# Patient Record
Sex: Female | Born: 1965 | Race: White | Hispanic: No | Marital: Married | Smoking: Former smoker
Health system: Southern US, Community
[De-identification: ages and names within clinical notes are randomized; demographics above are authoritative.]

## PROBLEM LIST (undated history)

## (undated) DIAGNOSIS — M722 Plantar fascial fibromatosis: Secondary | ICD-10-CM

## (undated) DIAGNOSIS — N951 Menopausal and female climacteric states: Secondary | ICD-10-CM

## (undated) DIAGNOSIS — J189 Pneumonia, unspecified organism: Secondary | ICD-10-CM

## (undated) DIAGNOSIS — M8580 Other specified disorders of bone density and structure, unspecified site: Secondary | ICD-10-CM

## (undated) DIAGNOSIS — R519 Headache, unspecified: Secondary | ICD-10-CM

## (undated) DIAGNOSIS — E785 Hyperlipidemia, unspecified: Secondary | ICD-10-CM

## (undated) DIAGNOSIS — R55 Syncope and collapse: Secondary | ICD-10-CM

## (undated) DIAGNOSIS — R42 Dizziness and giddiness: Secondary | ICD-10-CM

## (undated) DIAGNOSIS — M810 Age-related osteoporosis without current pathological fracture: Secondary | ICD-10-CM

## (undated) DIAGNOSIS — R51 Headache: Secondary | ICD-10-CM

## (undated) HISTORY — DX: Headache: R51

## (undated) HISTORY — DX: Hyperlipidemia, unspecified: E78.5

## (undated) HISTORY — DX: Pneumonia, unspecified organism: J18.9

## (undated) HISTORY — DX: Age-related osteoporosis without current pathological fracture: M81.0

## (undated) HISTORY — DX: Dizziness and giddiness: R42

## (undated) HISTORY — DX: Syncope and collapse: R55

## (undated) HISTORY — DX: Headache, unspecified: R51.9

## (undated) HISTORY — DX: Other specified disorders of bone density and structure, unspecified site: M85.80

## (undated) HISTORY — DX: Menopausal and female climacteric states: N95.1

## (undated) HISTORY — DX: Plantar fascial fibromatosis: M72.2

---

## 1986-11-11 HISTORY — PX: BREAST REDUCTION SURGERY: SHX8

## 1986-11-11 HISTORY — PX: REDUCTION MAMMAPLASTY: SUR839

## 2000-11-11 HISTORY — PX: BUNIONECTOMY: SHX129

## 2005-09-16 ENCOUNTER — Ambulatory Visit: Payer: Self-pay | Admitting: Family Medicine

## 2006-10-18 ENCOUNTER — Encounter: Admission: RE | Admit: 2006-10-18 | Discharge: 2006-10-18 | Payer: Self-pay | Admitting: Obstetrics and Gynecology

## 2007-08-17 DIAGNOSIS — K219 Gastro-esophageal reflux disease without esophagitis: Secondary | ICD-10-CM | POA: Insufficient documentation

## 2007-11-23 ENCOUNTER — Other Ambulatory Visit: Admission: RE | Admit: 2007-11-23 | Discharge: 2007-11-23 | Payer: Self-pay | Admitting: Obstetrics & Gynecology

## 2007-11-27 ENCOUNTER — Encounter: Admission: RE | Admit: 2007-11-27 | Discharge: 2007-11-27 | Payer: Self-pay | Admitting: Obstetrics & Gynecology

## 2008-10-06 ENCOUNTER — Emergency Department (HOSPITAL_COMMUNITY): Admission: EM | Admit: 2008-10-06 | Discharge: 2008-10-06 | Payer: Self-pay | Admitting: Emergency Medicine

## 2008-11-27 ENCOUNTER — Other Ambulatory Visit: Admission: RE | Admit: 2008-11-27 | Discharge: 2008-11-27 | Payer: Self-pay | Admitting: Obstetrics and Gynecology

## 2008-11-27 ENCOUNTER — Encounter: Admission: RE | Admit: 2008-11-27 | Discharge: 2008-11-27 | Payer: Self-pay | Admitting: Obstetrics & Gynecology

## 2008-12-20 IMAGING — US US ABDOMEN COMPLETE
1 series · 14 of 25 positions shown · non-contrast
Comparison: None

CLINICAL DATA: Abdominal pain

ABDOMEN ULTRASOUND
TECHNIQUE: Complete abdominal ultrasound examination was performed
including evaluation of the liver, gallbladder, bile ducts,
pancreas, kidneys, spleen, IVC, and abdominal aorta.

[Series 1: unknown · 0.33mm/px · 14 of 61 slices shown]
[im 1/61]
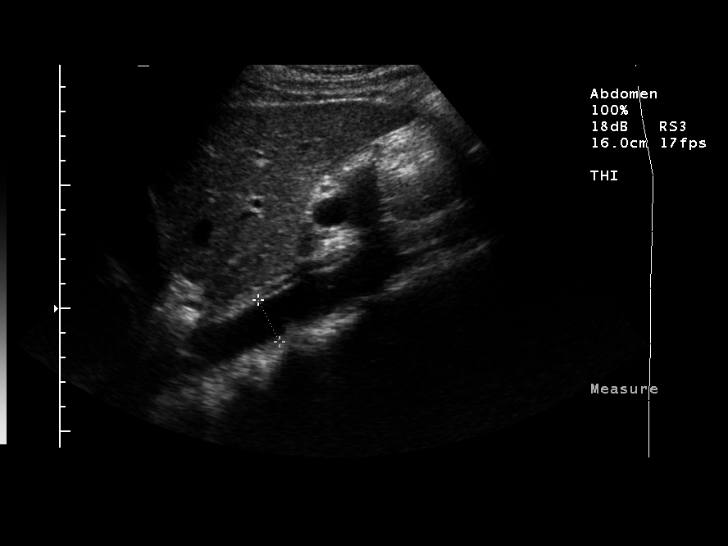
[im 6/61]
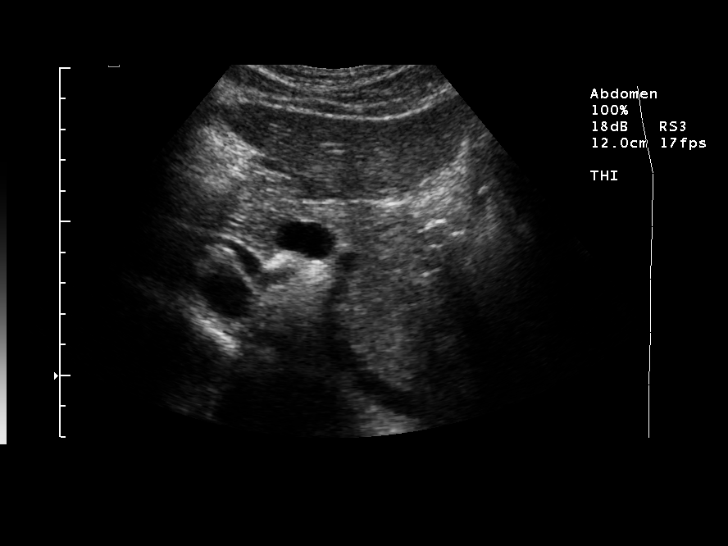
[im 11/61]
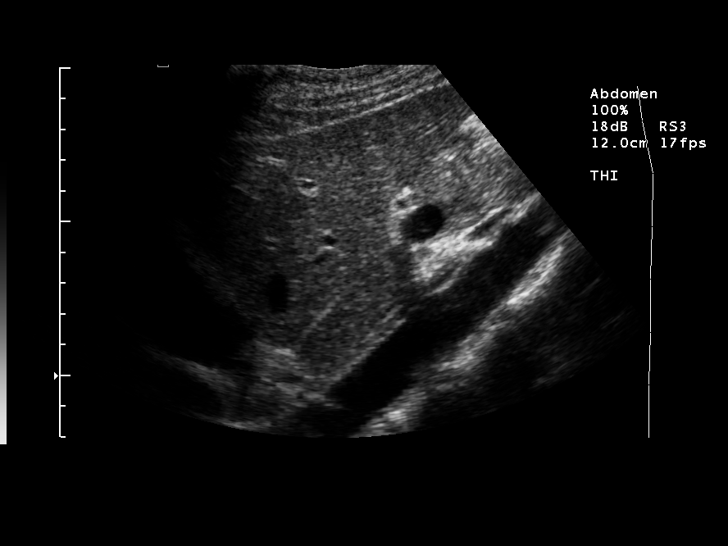
[im 16/61]
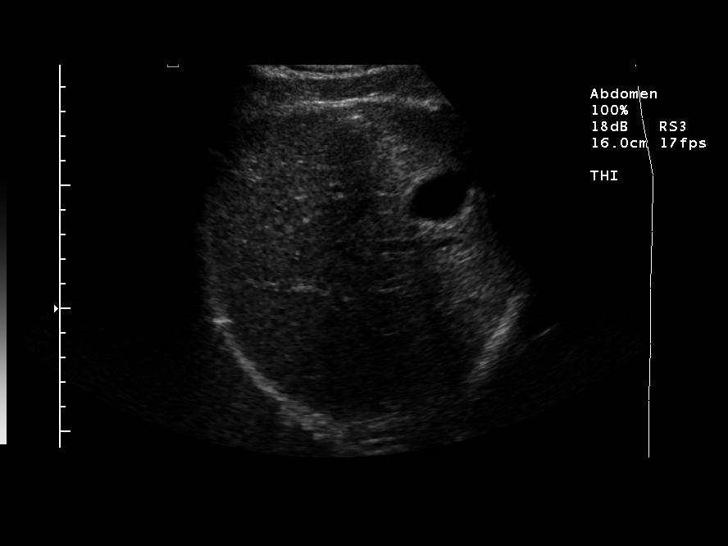
[im 21/61]
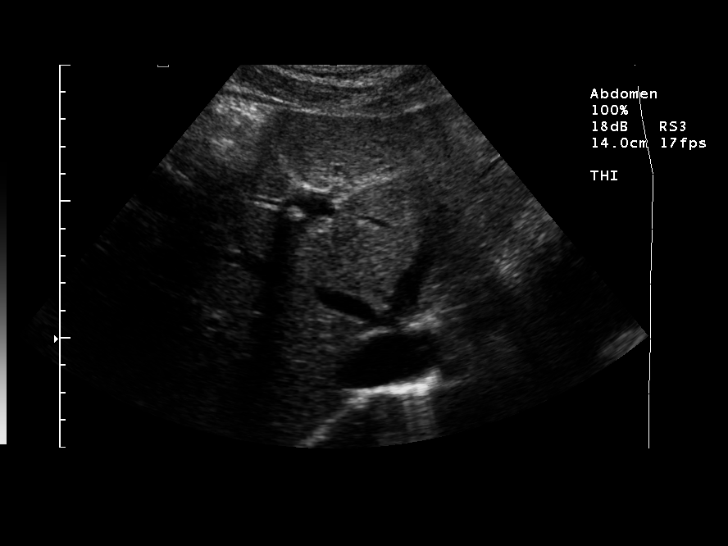
[im 23/61]
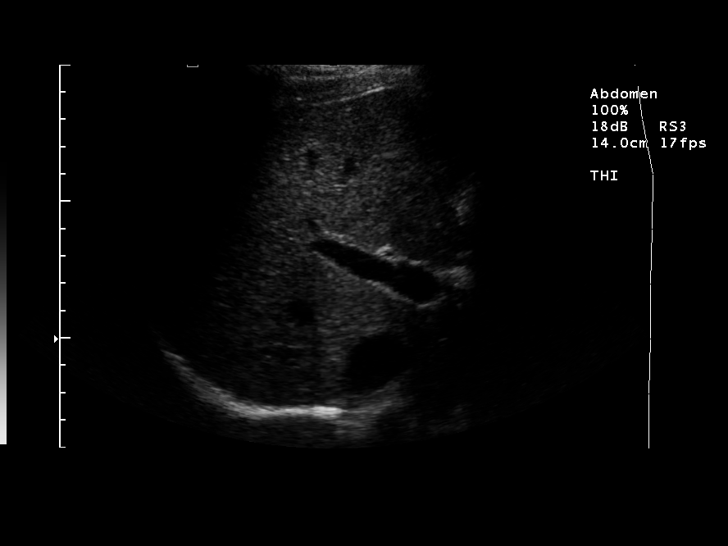
[im 28/61]
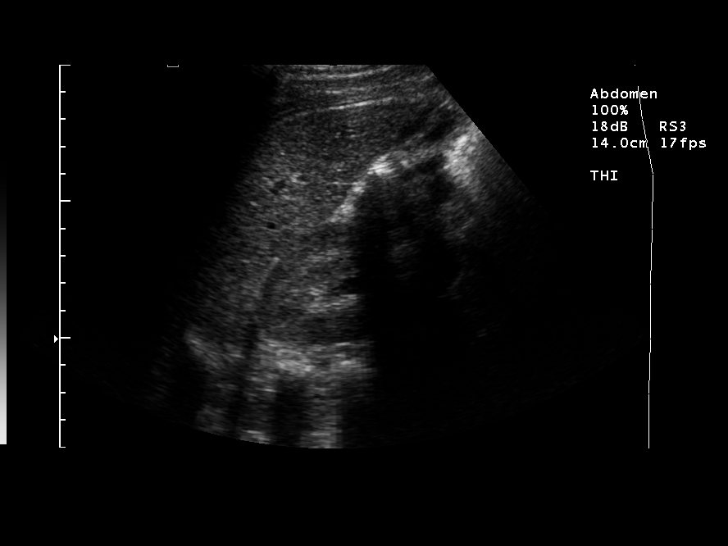
[im 33/61]
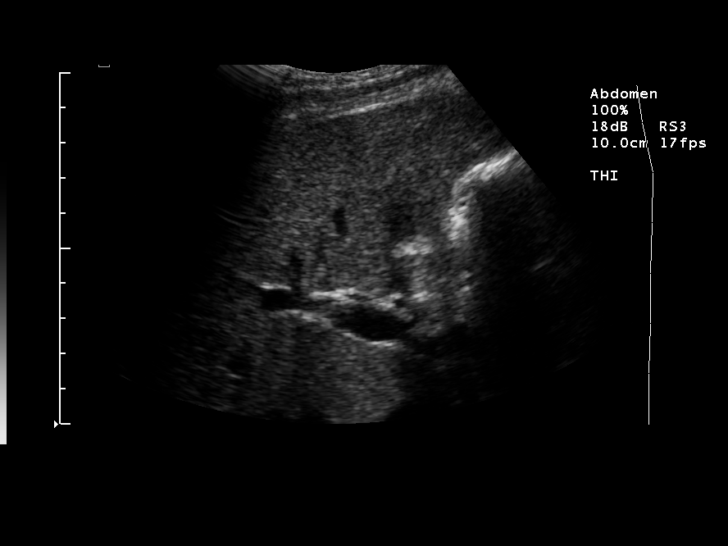
[im 38/61]
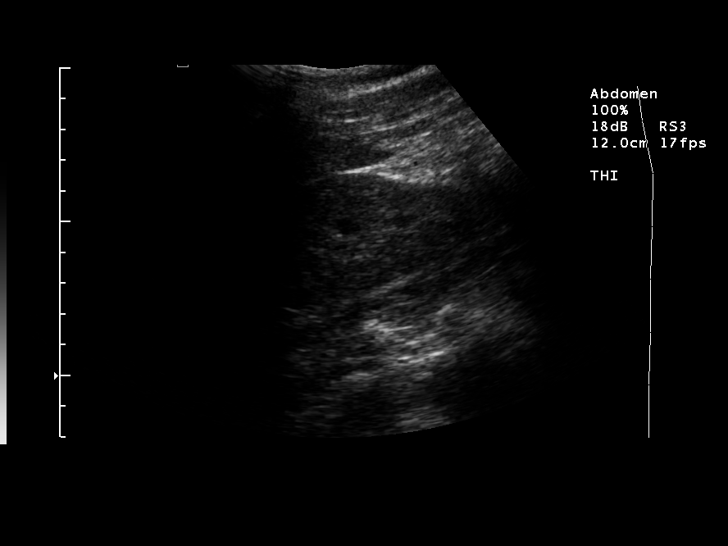
[im 41/61]
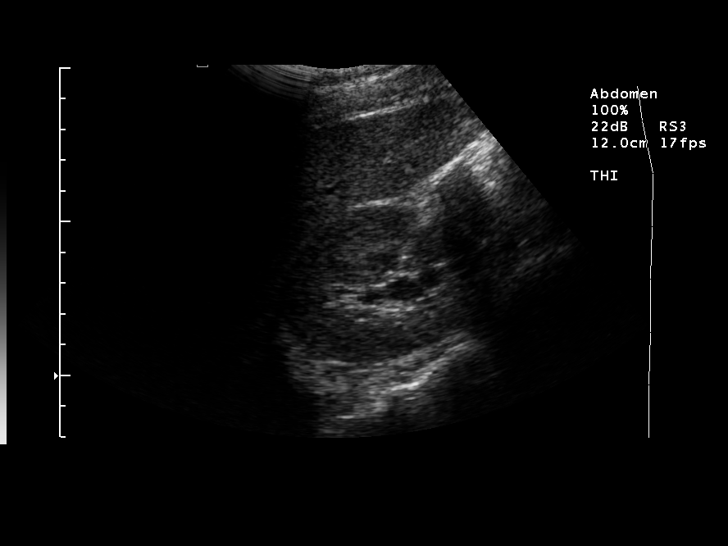
[im 46/61]
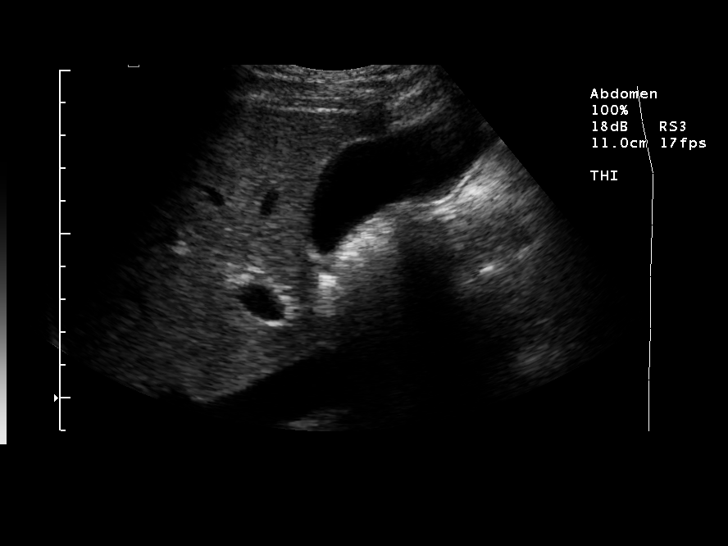
[im 51/61]
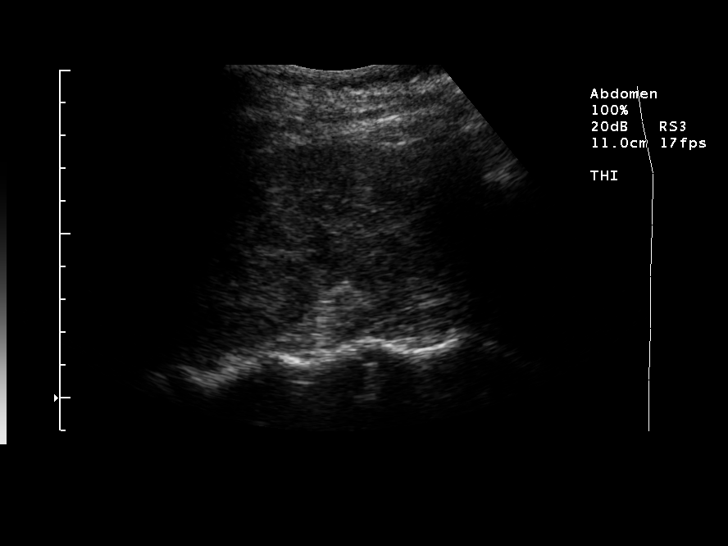
[im 56/61]
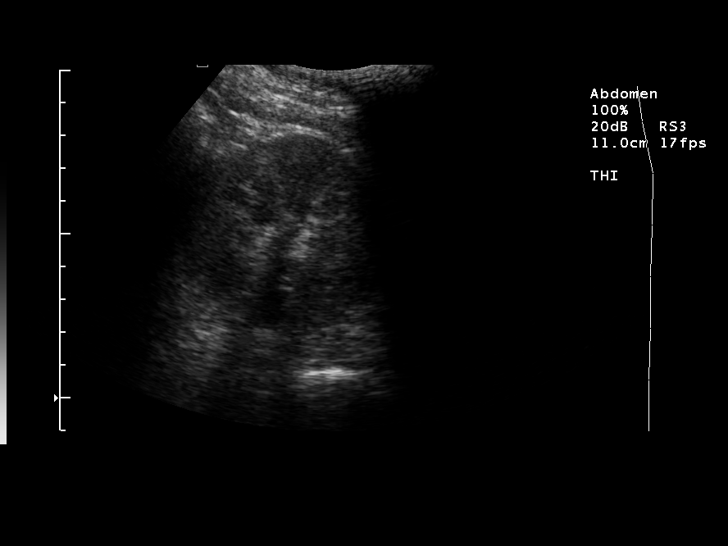
[im 61/61]
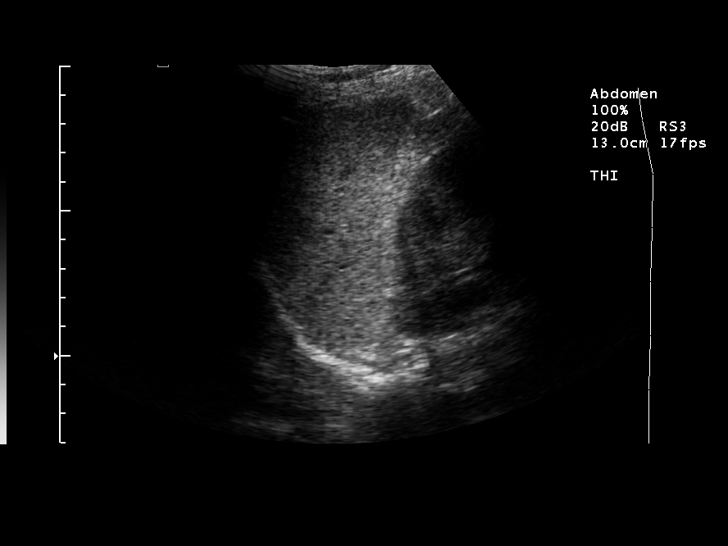

[14 of 25 positions shown; findings below may reference images not displayed]

FINDINGS: The liver is sonographically unremarkable.  No focal
lesions or intrahepatic biliary distention.  The common bile duct
is normal in caliber measuring 2.6 mm.  The gallbladder is
sonographically normal.

The IVC and aorta are normal in caliber.  The pancreas is fairly
well visualized demonstrates no sonographic abnormalities.

The spleen is normal in size and demonstrates normal echogenicity.
No focal lesions.

The right kidney measures 11.6 cm and the left kidney measures
cm.  Both kidneys demonstrate normal renal cortical thickness and
echogenicity without hydronephrosis.
IMPRESSION: 1.  Unremarkable abdominal ultrasound examination.

## 2009-11-30 ENCOUNTER — Encounter: Admission: RE | Admit: 2009-11-30 | Discharge: 2009-11-30 | Payer: Self-pay | Admitting: Unknown Physician Specialty

## 2009-12-10 ENCOUNTER — Encounter: Admission: RE | Admit: 2009-12-10 | Discharge: 2009-12-10 | Payer: Self-pay | Admitting: Unknown Physician Specialty

## 2009-12-12 DIAGNOSIS — J189 Pneumonia, unspecified organism: Secondary | ICD-10-CM

## 2009-12-12 HISTORY — DX: Pneumonia, unspecified organism: J18.9

## 2010-12-03 ENCOUNTER — Encounter
Admission: RE | Admit: 2010-12-03 | Discharge: 2010-12-03 | Payer: Self-pay | Source: Home / Self Care | Attending: Unknown Physician Specialty | Admitting: Unknown Physician Specialty

## 2011-01-02 ENCOUNTER — Encounter: Payer: Self-pay | Admitting: Unknown Physician Specialty

## 2011-07-13 DIAGNOSIS — R55 Syncope and collapse: Secondary | ICD-10-CM

## 2011-07-13 HISTORY — DX: Syncope and collapse: R55

## 2011-09-13 LAB — COMPREHENSIVE METABOLIC PANEL
ALT: 12
AST: 18
Albumin: 4.3
BUN: 8
CO2: 31
Chloride: 102
GFR calc non Af Amer: >60
Glucose, Bld: 98
Potassium: 4.5
Sodium: 137

## 2011-09-13 LAB — URINE MICROSCOPIC-ADD ON

## 2011-09-13 LAB — POCT PREGNANCY, URINE: Preg Test, Ur: NEGATIVE

## 2011-09-13 LAB — DIFFERENTIAL
Basophils Absolute: 0
Basophils Relative: 0
Eosinophils Absolute: 0
Eosinophils Relative: 1

## 2011-09-13 LAB — LIPASE, BLOOD: Lipase: 33

## 2011-09-13 LAB — URINALYSIS, ROUTINE W REFLEX MICROSCOPIC
Hgb urine dipstick: NEGATIVE
Nitrite: NEGATIVE
Specific Gravity, Urine: 1.006

## 2011-09-13 LAB — CBC
Hemoglobin: 13.6
MCHC: 33.7
RDW: 12.1
WBC: 7.1

## 2011-11-11 ENCOUNTER — Other Ambulatory Visit: Payer: Self-pay | Admitting: Unknown Physician Specialty

## 2011-11-11 DIAGNOSIS — Z1231 Encounter for screening mammogram for malignant neoplasm of breast: Secondary | ICD-10-CM

## 2011-12-07 ENCOUNTER — Ambulatory Visit
Admission: RE | Admit: 2011-12-07 | Discharge: 2011-12-07 | Disposition: A | Discharge status: Home / Self Care | Payer: BC Managed Care – PPO | Source: Ambulatory Visit | Attending: Unknown Physician Specialty | Admitting: Unknown Physician Specialty

## 2011-12-07 DIAGNOSIS — Z1231 Encounter for screening mammogram for malignant neoplasm of breast: Secondary | ICD-10-CM

## 2012-11-02 ENCOUNTER — Other Ambulatory Visit: Payer: Self-pay | Admitting: Unknown Physician Specialty

## 2012-11-02 DIAGNOSIS — Z1231 Encounter for screening mammogram for malignant neoplasm of breast: Secondary | ICD-10-CM

## 2012-12-07 ENCOUNTER — Ambulatory Visit
Admission: RE | Admit: 2012-12-07 | Discharge: 2012-12-07 | Disposition: A | Discharge status: Home / Self Care | Payer: BC Managed Care – PPO | Source: Ambulatory Visit | Attending: Unknown Physician Specialty | Admitting: Unknown Physician Specialty

## 2012-12-07 DIAGNOSIS — Z1231 Encounter for screening mammogram for malignant neoplasm of breast: Secondary | ICD-10-CM

## 2013-11-18 ENCOUNTER — Other Ambulatory Visit: Payer: Self-pay

## 2013-11-18 DIAGNOSIS — Z1231 Encounter for screening mammogram for malignant neoplasm of breast: Secondary | ICD-10-CM

## 2013-12-09 ENCOUNTER — Ambulatory Visit
Admission: RE | Admit: 2013-12-09 | Discharge: 2013-12-09 | Disposition: A | Discharge status: Home / Self Care | Payer: BC Managed Care – PPO | Source: Ambulatory Visit

## 2013-12-09 DIAGNOSIS — Z1231 Encounter for screening mammogram for malignant neoplasm of breast: Secondary | ICD-10-CM

## 2014-08-26 ENCOUNTER — Ambulatory Visit (INDEPENDENT_AMBULATORY_CARE_PROVIDER_SITE_OTHER): Payer: BC Managed Care – PPO

## 2014-08-26 ENCOUNTER — Other Ambulatory Visit: Payer: Self-pay | Admitting: Podiatry

## 2014-08-26 ENCOUNTER — Ambulatory Visit (INDEPENDENT_AMBULATORY_CARE_PROVIDER_SITE_OTHER): Payer: BC Managed Care – PPO | Admitting: Podiatry

## 2014-08-26 ENCOUNTER — Encounter: Payer: Self-pay | Admitting: Podiatry

## 2014-08-26 VITALS — BP 104/57 | HR 85 | Resp 16 | Ht 65.5 in | Wt 139.0 lb

## 2014-08-26 DIAGNOSIS — M722 Plantar fascial fibromatosis: Secondary | ICD-10-CM

## 2014-08-26 MED ORDER — MELOXICAM 15 MG PO TABS: 15.0000 mg | ORAL_TABLET | Freq: Every day | ORAL | Status: DC

## 2014-08-26 MED ORDER — METHYLPREDNISOLONE (PAK) 4 MG PO TABS: ORAL_TABLET | ORAL | Status: DC

## 2014-08-26 NOTE — Progress Notes (Signed)
   Subjective:    Patient ID: Desiree Gardner, female    DOB: 1966-08-26, 48 y.o.   MRN: [id number]  HPI Comments: "I feel its fasciitis"  Patient c/o aching plantar/lateral heel right for about 3 weeks. She does have AM pain. She is having hip pain. Worse with walking a lot. She has tried ice and sneakers.   Foot Pain Associated symptoms include arthralgias and myalgias.      Review of Systems  Genitourinary: Positive for urgency.  Musculoskeletal: Positive for arthralgias, back pain, gait problem and myalgias.  All other systems reviewed and are negative.      Objective:   Physical Exam: I have reviewed her past medical history medications allergies surgeries social history and review of systems. Pulses are strongly palpable bilateral. Neurologic sensorium is intact per Semmes-Weinstein monofilament. Deep tendon reflexes are intact bilateral muscle strength is 5 over 5 dorsiflexors plantar flexors inverters everters all intrinsic musculature is intact. Orthopedic evaluation demonstrates all joints distal to the ankle a full range of motion without crepitation. She has pain on palpation medial continued tubercle of the right heel.        Assessment & Plan:  Assessment: Plantar fasciitis right heel.  Plan: Started her on Medrol Dosepak to be followed by meloxicam. Injected her right heel at the point of maximal tenderness today with Kenalog and local anesthetic. She was dispensed a plantar fascial brace and a night splint. Discussed appropriate shoe gear stretching exercises ice therapy shoe gear modifications and I will followup with her in one month.

## 2014-08-26 NOTE — Patient Instructions (Signed)

## 2014-09-05 ENCOUNTER — Telehealth: Payer: Self-pay | Admitting: *Deleted

## 2014-09-05 ENCOUNTER — Other Ambulatory Visit: Payer: Self-pay | Admitting: *Deleted

## 2014-09-05 MED ORDER — DICLOFENAC SODIUM 75 MG PO TBEC: 75.0000 mg | DELAYED_RELEASE_TABLET | Freq: Two times a day (BID) | ORAL | Status: DC

## 2014-09-05 NOTE — Telephone Encounter (Signed)
Pt called said the mobic is giving her stomach pain and is making her foggy while driving in the AM. Pt said she has taken voltaren in the past and it done very well. Can she have a script for this?

## 2014-09-05 NOTE — Telephone Encounter (Signed)
yes

## 2014-09-05 NOTE — Telephone Encounter (Signed)
Per dr Paulla Dolly can have voltaren #60 with 1 refill. Sent to [facility].

## 2014-09-23 ENCOUNTER — Encounter: Payer: Self-pay | Admitting: Podiatry

## 2014-09-23 ENCOUNTER — Ambulatory Visit (INDEPENDENT_AMBULATORY_CARE_PROVIDER_SITE_OTHER): Payer: BC Managed Care – PPO | Admitting: Podiatry

## 2014-09-23 VITALS — BP 113/67 | HR 77 | Resp 16

## 2014-09-23 DIAGNOSIS — M722 Plantar fascial fibromatosis: Secondary | ICD-10-CM

## 2014-09-23 NOTE — Progress Notes (Signed)
She presents today states that her foot is doing much better she was unable to take the nonsteroidal anti-inflammatory drug because of gastritis. She also has not been wearing the night splint.  Objective: Vital signs are stable she is alert and oriented x3. She has pain on palpation medial continued tubercle of the right heel.  Assessment plantar fasciitis right.  Plan: Reinjected right heel today with Kenalog and local anesthetic. Discussed appropriate shoe gear stretching exercises ice therapy shoe gear modification and the use of her night splint. I will followup with her in one month if necessary.

## 2014-10-21 ENCOUNTER — Ambulatory Visit (INDEPENDENT_AMBULATORY_CARE_PROVIDER_SITE_OTHER): Payer: BC Managed Care – PPO | Admitting: Podiatry

## 2014-10-21 VITALS — BP 121/73 | HR 85 | Resp 16

## 2014-10-21 DIAGNOSIS — M722 Plantar fascial fibromatosis: Secondary | ICD-10-CM

## 2014-10-21 DIAGNOSIS — M76821 Posterior tibial tendinitis, right leg: Secondary | ICD-10-CM

## 2014-10-21 DIAGNOSIS — G5751 Tarsal tunnel syndrome, right lower limb: Secondary | ICD-10-CM

## 2014-10-21 NOTE — Progress Notes (Signed)
She presents today for follow-up of her plantar fasciitis right heel. She states that it is getting worse rather than better. She states that she has had a lot of traveling to do and may have injured it during that time.  Objective: Vital signs are stable she is alert and oriented 3. Pulses are palpable right.she has pain on palpation of the medial calcaneal tubercle right heel no pain on medial and lateral compression. Plantar fascia is palpable. There is no ecchymosis no edema. She has tenderness on palpation of the posterior tibial tendon.and she has tenderness on palpation of the sinus tarsi and the tarsal tunnel.  Assessment: Plantar fasciitis resulting in possible tarsal tunnel syndrome with posterior tibial tendinitis.  Plan: MRI right foot and ankle. Placed her in a Cam Walker and continue all other conservative therapies.

## 2014-10-27 ENCOUNTER — Telehealth: Payer: Self-pay | Admitting: *Deleted

## 2014-10-27 ENCOUNTER — Ambulatory Visit
Admission: RE | Admit: 2014-10-27 | Discharge: 2014-10-27 | Disposition: A | Discharge status: Home / Self Care | Payer: BC Managed Care – PPO | Source: Ambulatory Visit | Attending: Podiatry | Admitting: Podiatry

## 2014-10-27 DIAGNOSIS — M76821 Posterior tibial tendinitis, right leg: Secondary | ICD-10-CM

## 2014-10-27 DIAGNOSIS — M722 Plantar fascial fibromatosis: Secondary | ICD-10-CM

## 2014-10-27 DIAGNOSIS — G5751 Tarsal tunnel syndrome, right lower limb: Secondary | ICD-10-CM

## 2014-10-27 MED ORDER — GADOBENATE DIMEGLUMINE 529 MG/ML IV SOLN
6.0000 mL | Freq: Once | INTRAVENOUS | Status: AC | PRN
  Administered 2014-10-27: 6 mL via INTRAVENOUS

## 2014-10-27 NOTE — Telephone Encounter (Signed)
-----   Message from Garrel Ridgel, [place] sent at 10/27/2014 11:51 AM EST ----- Please send the MRI for an over read.  Inform patient of the delay please. ----- Message -----    From: Rad Results In Interface    Sent: 10/27/2014  11:00 AM      To: Garrel Ridgel, DPM

## 2014-10-27 NOTE — Telephone Encounter (Signed)
I called and informed her that Dr. Milinda Pointer received the MRI results.  He wants to have them re-read by Dr. Judee Clara, who specializes in lower extremities.  "How long is this going to take, a day or two?"  I told her we should give her a call back sometime this week.  "Well is there any way we can expedite this because I'm in a lot of pain?"  I told her I would have to call and get the disk from [facility] then send it to [facility] Over-read.  "I have the disk and I can get my husband to drop it off over there.  Will that speed things up a little?"  I told her yes, it will.  "Email me the address and I can give the disk to my husband to take there for me today."  I sent her the address.  I faxed MRI results from [place] to Dr. Judee Clara at [facility] for a re-read.

## 2014-10-29 ENCOUNTER — Telehealth: Payer: Self-pay | Admitting: *Deleted

## 2014-10-29 NOTE — Telephone Encounter (Signed)
I think we have requested the overread and that it was just last Thursday or this Monday.  Initial dx was tarsal tunnel.  We will call her as soon as we get it in.

## 2014-10-29 NOTE — Telephone Encounter (Signed)
Pt states she took the MRI disc to Dr. Tamala Julian at the radiology dept for an overread, and she is call for the results.  Pt states she would like the results as soon as possible due to the change in the weather and the increase pain.

## 2014-10-30 NOTE — Telephone Encounter (Signed)
I called and informed the patient that Dr. Milinda Pointer said the MRI showed that it is Plantar Fasciitis.  He said to continue to wear the boot and he would like to see you back the week after Thanksgiving.  "That's it, why does it hurt worse this time?"  I asked if she had been doing exercises on a treadmill or anything.  "That's just it, I've refrained from doing anything at all.  I don't understand.  Well, can you schedule me an appointment?"  I scheduled her for 11/11/2014 at 4:15pm.

## 2014-10-30 NOTE — Telephone Encounter (Signed)
Informed pt's husband, Shea Stakes the re-read of the MRI results have not returned, that Dr. Milinda Pointer states continue with the Air Fracture Walker, rest, ice for comfort and Aleve if that is what she is taking.  I informed Mr. Eynon that the result maybe in a week or 2 and we would call her.

## 2014-10-31 ENCOUNTER — Encounter: Payer: Self-pay | Admitting: Podiatry

## 2014-11-11 ENCOUNTER — Encounter: Payer: Self-pay | Admitting: Podiatry

## 2014-11-11 ENCOUNTER — Ambulatory Visit (INDEPENDENT_AMBULATORY_CARE_PROVIDER_SITE_OTHER): Payer: BC Managed Care – PPO | Admitting: Podiatry

## 2014-11-11 DIAGNOSIS — G5751 Tarsal tunnel syndrome, right lower limb: Secondary | ICD-10-CM

## 2014-11-11 DIAGNOSIS — M722 Plantar fascial fibromatosis: Secondary | ICD-10-CM

## 2014-11-11 NOTE — Progress Notes (Signed)
She presents today for a follow-up of her MRI. She states that she still has bad days and continues to wear her Cam Walker.  Objective: Vital signs are stable she is alert and oriented 3 she has tenderness on palpation of the tarsal tunnel. She also has pain on palpation medial tubercle of the right heel. Pulses are strongly palpable.  Assessment: Rule out tarsal tunnel with entrapment of the medial calcaneal branch.  Plan: Injected the tarsal tunnel today with dexamethasone and local anesthetic. If this alleviated her symptoms for the next couple of weeks and we will know that it is tarsal tunnel syndrome. I will follow-up with her in 2 weeks. We will consider EP[facility] physical therapy sclerosing therapy or surgery.

## 2014-11-18 ENCOUNTER — Other Ambulatory Visit: Payer: Self-pay

## 2014-11-18 DIAGNOSIS — Z1231 Encounter for screening mammogram for malignant neoplasm of breast: Secondary | ICD-10-CM

## 2014-11-25 ENCOUNTER — Telehealth: Payer: Self-pay | Admitting: *Deleted

## 2014-11-25 ENCOUNTER — Encounter: Payer: Self-pay | Admitting: Podiatry

## 2014-11-25 ENCOUNTER — Ambulatory Visit (INDEPENDENT_AMBULATORY_CARE_PROVIDER_SITE_OTHER): Payer: BC Managed Care – PPO | Admitting: Podiatry

## 2014-11-25 VITALS — BP 121/70 | HR 80 | Resp 16

## 2014-11-25 DIAGNOSIS — G5751 Tarsal tunnel syndrome, right lower limb: Secondary | ICD-10-CM

## 2014-11-25 DIAGNOSIS — M76821 Posterior tibial tendinitis, right leg: Secondary | ICD-10-CM

## 2014-11-25 DIAGNOSIS — M722 Plantar fascial fibromatosis: Secondary | ICD-10-CM

## 2014-11-25 NOTE — Progress Notes (Signed)
She presents today for follow-up of her injection to her tarsal tunnel. She says that injection really did not make a difference either way and states that the outside of her foot is starting to become more painful. She still has pain on palpation medial tubercle of the right heel.  Objective: She still has pain on palpation medially continue tubercle she also has pain on palpation of the sinus tarsi and the posterior tibial tendon right.  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  Plan: We will send her to physical therapy and I will follow-up with her once physical therapy is done we will consider shockwave.

## 2014-11-25 NOTE — Telephone Encounter (Signed)
Pt's husband states pt was seen today and was instructed to remain in the air fracture walker for another month or so, and request a handicap sticker.  Dr. Milinda Pointer states okay to set pt up for 3 months with handicap placard.  Left message ont pt's husband's cellphone to pick up the handicap form from the back checkout desk.

## 2014-12-03 ENCOUNTER — Telehealth: Payer: Self-pay | Admitting: *Deleted

## 2014-12-03 NOTE — Telephone Encounter (Signed)
I called to refer the patient for physical therapy.  "Please send over an order and demographics please.  Our fax is [phone].  You can get our referral sheet off line."  I faxed the referral to [facility].

## 2014-12-03 NOTE — Telephone Encounter (Signed)
-----   Message from Cleon Gustin sent at 12/02/2014  3:50 PM EST ----- Regarding: Referral for Physical Therapy Contact: [phone] Patients husband called wanting to know the status of patient being referred out for physical therapy. Saw Dr. Milinda Pointer on 12.15.2015. Please call the patient back. Thank you.

## 2014-12-09 ENCOUNTER — Telehealth: Payer: Self-pay | Admitting: *Deleted

## 2014-12-09 NOTE — Telephone Encounter (Signed)
I called and informed her that I sent the referral to [facility] on 11/25/2014.  "Can you send it again they said they have not received it and can you send confirmation to me that it was sent?  When are you going to send it today or tomorrow?"  I told her I don't have access to a scanner.  I will send it now.  "Well can you at least send me an email telling me when it was sent?  My email address is [email or web address].  I will send you an email.  I faxed the referral to [facility] again for physical therapy.  I emailed the patient and informed her that the referral was sent on 12/03/2014 and I sent it today.  I received a confirmation that it went at 5:44pm.  Please call if you have any further questions.

## 2014-12-09 NOTE — Telephone Encounter (Signed)
Pt states she has not heard anything about beginning PT.

## 2014-12-10 ENCOUNTER — Ambulatory Visit
Admission: RE | Admit: 2014-12-10 | Discharge: 2014-12-10 | Disposition: A | Discharge status: Home / Self Care | Payer: BC Managed Care – PPO | Source: Ambulatory Visit

## 2014-12-10 DIAGNOSIS — Z1231 Encounter for screening mammogram for malignant neoplasm of breast: Secondary | ICD-10-CM

## 2014-12-16 ENCOUNTER — Other Ambulatory Visit: Payer: Self-pay | Admitting: Obstetrics & Gynecology

## 2014-12-16 DIAGNOSIS — R928 Other abnormal and inconclusive findings on diagnostic imaging of breast: Secondary | ICD-10-CM

## 2014-12-24 ENCOUNTER — Ambulatory Visit
Admission: RE | Admit: 2014-12-24 | Discharge: 2014-12-24 | Disposition: A | Discharge status: Home / Self Care | Payer: BLUE CROSS/BLUE SHIELD | Source: Ambulatory Visit | Attending: Obstetrics & Gynecology | Admitting: Obstetrics & Gynecology

## 2014-12-24 DIAGNOSIS — R928 Other abnormal and inconclusive findings on diagnostic imaging of breast: Secondary | ICD-10-CM

## 2014-12-25 ENCOUNTER — Other Ambulatory Visit: Payer: Self-pay

## 2015-11-09 ENCOUNTER — Other Ambulatory Visit: Payer: Self-pay

## 2015-11-09 DIAGNOSIS — Z1231 Encounter for screening mammogram for malignant neoplasm of breast: Secondary | ICD-10-CM

## 2015-12-21 ENCOUNTER — Ambulatory Visit: Payer: BLUE CROSS/BLUE SHIELD

## 2015-12-31 ENCOUNTER — Ambulatory Visit
Admission: RE | Admit: 2015-12-31 | Discharge: 2015-12-31 | Disposition: A | Discharge status: Home / Self Care | Payer: BLUE CROSS/BLUE SHIELD | Source: Ambulatory Visit

## 2015-12-31 DIAGNOSIS — Z1231 Encounter for screening mammogram for malignant neoplasm of breast: Secondary | ICD-10-CM

## 2016-11-25 ENCOUNTER — Other Ambulatory Visit: Payer: Self-pay | Admitting: Obstetrics & Gynecology

## 2016-11-25 DIAGNOSIS — Z1231 Encounter for screening mammogram for malignant neoplasm of breast: Secondary | ICD-10-CM

## 2017-01-02 ENCOUNTER — Ambulatory Visit
Admission: RE | Admit: 2017-01-02 | Discharge: 2017-01-02 | Disposition: A | Discharge status: Home / Self Care | Payer: Managed Care, Other (non HMO) | Source: Ambulatory Visit | Attending: Obstetrics & Gynecology | Admitting: Obstetrics & Gynecology

## 2017-01-02 DIAGNOSIS — Z1231 Encounter for screening mammogram for malignant neoplasm of breast: Secondary | ICD-10-CM

## 2017-05-01 ENCOUNTER — Other Ambulatory Visit: Payer: Self-pay | Admitting: Internal Medicine

## 2017-05-01 DIAGNOSIS — R519 Headache, unspecified: Secondary | ICD-10-CM

## 2017-05-01 DIAGNOSIS — R51 Headache: Principal | ICD-10-CM

## 2017-05-01 DIAGNOSIS — R42 Dizziness and giddiness: Secondary | ICD-10-CM

## 2017-05-02 ENCOUNTER — Ambulatory Visit
Admission: RE | Admit: 2017-05-02 | Discharge: 2017-05-02 | Disposition: A | Discharge status: Home / Self Care | Payer: Managed Care, Other (non HMO) | Source: Ambulatory Visit | Attending: Internal Medicine | Admitting: Internal Medicine

## 2017-05-02 DIAGNOSIS — R51 Headache: Principal | ICD-10-CM

## 2017-05-02 DIAGNOSIS — R42 Dizziness and giddiness: Secondary | ICD-10-CM

## 2017-05-02 DIAGNOSIS — R519 Headache, unspecified: Secondary | ICD-10-CM

## 2017-11-27 ENCOUNTER — Other Ambulatory Visit: Payer: Self-pay | Admitting: Obstetrics & Gynecology

## 2017-11-27 DIAGNOSIS — Z1231 Encounter for screening mammogram for malignant neoplasm of breast: Secondary | ICD-10-CM

## 2018-01-05 ENCOUNTER — Ambulatory Visit: Payer: Managed Care, Other (non HMO)

## 2018-01-05 ENCOUNTER — Ambulatory Visit
Admission: RE | Admit: 2018-01-05 | Discharge: 2018-01-05 | Disposition: A | Discharge status: Home / Self Care | Payer: Managed Care, Other (non HMO) | Source: Ambulatory Visit | Attending: Obstetrics & Gynecology | Admitting: Obstetrics & Gynecology

## 2018-01-05 DIAGNOSIS — Z1231 Encounter for screening mammogram for malignant neoplasm of breast: Secondary | ICD-10-CM

## 2018-08-23 ENCOUNTER — Ambulatory Visit (HOSPITAL_COMMUNITY)
Admission: RE | Admit: 2018-08-23 | Discharge: 2018-08-23 | Disposition: A | Discharge status: Home / Self Care | Payer: Managed Care, Other (non HMO) | Source: Ambulatory Visit | Attending: Internal Medicine | Admitting: Internal Medicine

## 2018-08-23 ENCOUNTER — Other Ambulatory Visit (HOSPITAL_COMMUNITY): Payer: Self-pay | Admitting: Internal Medicine

## 2018-08-23 DIAGNOSIS — R42 Dizziness and giddiness: Secondary | ICD-10-CM

## 2018-08-23 DIAGNOSIS — R51 Headache: Principal | ICD-10-CM

## 2018-08-23 DIAGNOSIS — R519 Headache, unspecified: Secondary | ICD-10-CM

## 2018-08-27 ENCOUNTER — Ambulatory Visit: Payer: Managed Care, Other (non HMO) | Admitting: Neurology

## 2018-08-27 ENCOUNTER — Encounter: Payer: Self-pay | Admitting: Neurology

## 2018-08-27 VITALS — Ht 65.5 in | Wt 144.0 lb

## 2018-08-27 DIAGNOSIS — H8149 Vertigo of central origin, unspecified ear: Secondary | ICD-10-CM | POA: Diagnosis not present

## 2018-08-27 DIAGNOSIS — R270 Ataxia, unspecified: Secondary | ICD-10-CM | POA: Diagnosis not present

## 2018-08-27 DIAGNOSIS — I670 Dissection of cerebral arteries, nonruptured: Secondary | ICD-10-CM

## 2018-08-27 DIAGNOSIS — H814 Vertigo of central origin: Secondary | ICD-10-CM

## 2018-08-27 DIAGNOSIS — R42 Dizziness and giddiness: Secondary | ICD-10-CM | POA: Diagnosis not present

## 2018-08-27 NOTE — Progress Notes (Signed)
[facility]    Provider:  Dr Jaynee Eagles Referring Provider: Prince Solian, MD Primary Care Physician:  Prince Solian, MD  CC:  Dizziness  HPI:  Desiree Gardner is a 52 y.o. female here as requested by Dr. Dagmar Hait for dizziness. First episode over a year ago, she was driving and felt like wind was pushing her head.  PMHx dizziness, vertigo, alcohol abuse. Last episode, recently last week she flew to [place], there was stress, AC in the office was out, she woke up the next day and about 10am she felt "off" maybe dizzy, she she got up to walk down the hallway she was dizzy and her body was being pulled to the left, then the room started spinning or she was spinning, she had to hold on the desk, putting her head down made it completely worse, no inciting events, no problem on the flight, she had to have people help her walk, she had to sit in one position, severe nausea, light sensitivity, neck stiffness, tylenol, all headaches are the back of her head and neck pain center base of skull, strain constant, +photophobia, +nausea, movement made it worse. Tylenol helped. Very fatigued afterwards when she got home from [place]. She continues to have symptoms and felt their was "blood flow issue". No other focal neurologic deficits. She is having moments she is working harder to work, she feels tird and "drawn".  She has had prior incidents of not feeling right, heat, like her "brain was melting", over labor day weekend, she felt hot, no loss of consciousness, she has a "high threshold to pain", No hx of migraines in the family.   Reviewed notes, labs and imaging from outside physicians, which showed: reviewed Dr. Danna Hefty notes. Patient with episodes of vertigo. ntense pressure in the head. Body feels like it is "pulling to the right". She couldn't walk straight, couldn;t hold head straight with associated headache. Hx of vertigo starting a year ago. Ultimately went to h/a clinic after a series of  testing. She declined starting AED (topiramate?). Recent increase in stress. She was offered valium and phenergan for her episodes. Benadryl made her sedated. MRI brain was ordered, ENT was considered, most recent episode after flying to [place].   08/23/2018: BMP nml (BUN 13, Creat 0.8)  CT 05/02/2017 showed No acute intracranial abnormalities including mass lesion or mass effect, hydrocephalus, extra-axial fluid collection, midline shift, hemorrhage, or acute infarction, large ischemic events (personally reviewed images)     Review of Systems: Patient complains of symptoms per HPI as well as the following symptoms: headache, weakness, dizziness, fatigue, spinning sensation. Pertinent negatives and positives per HPI. All others negative.   Social History   Socioeconomic History  . Marital status: Married    Spouse name: Not on file  . Number of children: 1  . Years of education: Not on file  . Highest education level: Master's degree (e.g., MA, MS, MEng, MEd, MSW, MBA)  Occupational History  . Not on file  Social Needs  . Financial resource strain: Not on file  . Food insecurity:    Worry: Not on file    Inability: Not on file  . Transportation needs:    Medical: Not on file    Non-medical: Not on file  Tobacco Use  . Smoking status: Former Research scientist (life sciences)  . Smokeless tobacco: Never Used  . Tobacco comment: smoked cigarette a couple of times in high school  Substance and Sexual Activity  . Alcohol use: Yes    Comment:  occasionally, not even weekly  . Drug use: Never  . Sexual activity: Not on file  Lifestyle  . Physical activity:    Days per week: Not on file    Minutes per session: Not on file  . Stress: Not on file  Relationships  . Social connections:    Talks on phone: Not on file    Gets together: Not on file    Attends religious service: Not on file    Active member of club or organization: Not on file    Attends meetings of clubs or organizations: Not on file     Relationship status: Not on file  . Intimate partner violence:    Fear of current or ex partner: Not on file    Emotionally abused: Not on file    Physically abused: Not on file    Forced sexual activity: Not on file  Other Topics Concern  . Not on file  Social History Narrative   Lives at home with spouse & son   Right handed   Caffeine: 2 cups of coffee/day    Family History  Problem Relation Age of Onset  . Dementia Mother   . Kidney disease Father        on Dialysis, stage IV  . Osteoporosis Sister   . Glaucoma Sister   . Other Brother        prediabetic  . Multiple myeloma Paternal Grandmother   . Diabetes type II Paternal Grandfather   . Dementia Paternal Grandfather   . Alzheimer's disease Paternal Grandfather     Past Medical History:  Diagnosis Date  . Headache    Dr. Domingo Cocking  . IC (intermittent claudication) ([facility])   . Plantar fasciitis   . PNA (pneumonia) 2011  . Syncope 07/2011   fall, under stress  . Vertigo     Past Surgical History:  Procedure Laterality Date  . BREAST REDUCTION SURGERY  11/1986  . BUNIONECTOMY  11/2000    Current Outpatient Medications  Medication Sig Dispense Refill  . diphenhydrAMINE (BENADRYL) 50 MG tablet Take 50 mg by mouth at bedtime.    Marland Kitchen UNABLE TO FIND Med Name: Sensimyst daily    . UNABLE TO FIND Med Name: Allergy Shots     No current facility-administered medications for this visit.     Allergies as of 08/27/2018 - Review Complete 08/27/2018  Allergen Reaction Noted  . Sulfonamide derivatives Shortness Of Breath 08/17/2007  . Codeine  08/17/2007  . Mold extract [trichophyton]  08/27/2018  . Penicillins  08/17/2007  . Pollen extract  08/27/2018    Vitals: Ht 5' 5.5" (1.664 m)   Wt 144 lb (65.3 kg)   BMI 23.60 kg/m  Last Weight:  Wt Readings from Last 1 Encounters:  08/27/18 144 lb (65.3 kg)   Last Height:   Ht Readings from Last 1 Encounters:  08/27/18 5' 5.5" (1.664 m)   Physical exam: Exam: Gen:  Anxious, conversant, well nourised, thin, well groomed                     CV: RRR, no MRG. No Carotid Bruits. No peripheral edema, warm, nontender Eyes: Conjunctivae clear without exudates or hemorrhage  Neuro: Detailed Neurologic Exam  Speech:    Speech is normal; fluent and spontaneous with normal comprehension.  Cognition:    The patient is oriented to person, place, and time;     recent and remote memory intact;     language fluent;  normal attention, concentration,     fund of knowledge Cranial Nerves:    The pupils are equal, round, and reactive to light. The fundi are normal and spontaneous venous pulsations are present. Visual fields are full to finger confrontation. Extraocular movements are intact. Trigeminal sensation is intact and the muscles of mastication are normal. The face is symmetric. The palate elevates in the midline. Hearing intact. Voice is normal. Shoulder shrug is normal. The tongue has normal motion without fasciculations.   Coordination:    Normal finger to nose and heel to shin. Normal rapid alternating movements.   Gait:    Heel-toe and tandem gait are normal.   Motor Observation:    No asymmetry, no atrophy, and no involuntary movements noted. Tone:    Normal muscle tone.    Posture:    Posture is normal. normal erect    Strength:    Strength is V/V in the upper and lower limbs.      Sensation: intact to LT     Reflex Exam:  DTR's:    Deep tendon reflexes in the upper and lower extremities are normal bilaterally.   Toes:    The toes are downgoing bilaterally.   Clonus:    Clonus is absent.   123/82  132/80  121/79     BP Location  LeftArm  LeftArm  LeftArm   Patient Position  Sitting  Standing  Standing   Pulse  79  79  83        Assessment/Plan:  Patient with acute onset vertigo, ataxia, dizziness, neck pain need work up to evaluate for stroke and carotid/vertebral dissection. Tried to have MRI with benzo  medication but had a panic attack  Vascuaar dissection/stroke vs Vestibular migraine vs Vestibular neuritis or Benign Positional Vertigo. Also may consider vaso vagal syncope and extreme anxiety.  Need Imaging of the brain and blood vessels to rule out strokes or vascular causes including dissection due to concerning acute onset headache, vertigo, nausea, ataxia, dizziness - also look for schwannoma or other lsesions  Needs sedated MRI, tried with anti-anxiety medication and coul dnot tolerate  Saw an ENT referral for vertigo and ruled out BPPV but may have fluid behind the ears, offered to place tubes. Will see on MRI.  [facility]. She also saw allergist.   Vestibular therapy for vertigo  Orders Placed This Encounter  Procedures  . MR BRAIN W WO CONTRAST  . MR MRA HEAD WO CONTRAST  . MR MRA NECK W WO CONTRAST  . Ambulatory referral to Physical Therapy    Discussed: To prevent or relieve headaches, try the following: Cool Compress. Lie down and place a cool compress on your head.  Avoid headache triggers. If certain foods or odors seem to have triggered your migraines in the past, avoid them. A headache diary might help you identify triggers.  Include physical activity in your daily routine. Try a daily walk or other moderate aerobic exercise.  Manage stress. Find healthy ways to cope with the stressors, such as delegating tasks on your to-do list.  Practice relaxation techniques. Try deep breathing, yoga, massage and visualization.  Eat regularly. Eating regularly scheduled meals and maintaining a healthy diet might help prevent headaches. Also, drink plenty of fluids.  Follow a regular sleep schedule. Sleep deprivation might contribute to headaches Consider biofeedback. With this mind-body technique, you learn to control certain bodily functions - such as muscle tension, heart rate and blood  pressure - to prevent headaches or reduce headache  pain.    Proceed to emergency room if you experience new or worsening symptoms or symptoms do not resolve, if you have new neurologic symptoms or if headache is severe, or for any concerning symptom.   Provided education and documentation from American headache Society toolbox including articles on: chronic migraine medication overuse headache, chronic migraines, prevention of migraines, behavioral and other nonpharmacologic treatments for headache.     Orders Placed This Encounter  Procedures  . MR BRAIN W WO CONTRAST  . MR MRA HEAD WO CONTRAST  . MR MRA NECK W WO CONTRAST  . Ambulatory referral to Physical Therapy     Cc:  Prince Solian, MD  Sarina Ill, MD  [facility] [address] [place], [place] [address]  Phone [phone] Fax [phone]

## 2018-08-27 NOTE — Patient Instructions (Addendum)
Differential for vertigo/dizziness: Vestibular migraine vs Vestibular neuritis or Benign Positional Vertigo, also inner ear pathology, cervicogenic vertigo   Also may consider vaso vagal syncope for the episodes when you feel like you are going to pass out  Need Imaging of the brain and blood vessels to rule out strokes or vascular causes    Vertigo Vertigo is the feeling that you or your surroundings are moving when they are not. Vertigo can be dangerous if it occurs while you are doing something that could endanger you or others, such as driving. What are the causes? This condition is caused by a disturbance in the signals that are sent by your body's sensory systems to your brain. Different causes of a disturbance can lead to vertigo, including:  Infections, especially in the inner ear.  A bad reaction to a drug, or misuse of alcohol and medicines.  Withdrawal from drugs or alcohol.  Quickly changing positions, as when lying down or rolling over in bed.  Migraine headaches.  Decreased blood flow to the brain.  Decreased blood pressure.  Increased pressure in the brain from a head or neck injury, stroke, infection, tumor, or bleeding.  Central nervous system disorders.  What are the signs or symptoms? Symptoms of this condition usually occur when you move your head or your eyes in different directions. Symptoms may start suddenly, and they usually last for less than a minute. Symptoms may include:  Loss of balance and falling.  Feeling like you are spinning or moving.  Feeling like your surroundings are spinning or moving.  Nausea and vomiting.  Blurred vision or double vision.  Difficulty hearing.  Slurred speech.  Dizziness.  Involuntary eye movement (nystagmus).  Symptoms can be mild and cause only slight annoyance, or they can be severe and interfere with daily life. Episodes of vertigo may return (recur) over time, and they are often triggered by certain  movements. Symptoms may improve over time. How is this diagnosed? This condition may be diagnosed based on medical history and the quality of your nystagmus. Your health care provider may test your eye movements by asking you to quickly change positions to trigger the nystagmus. This may be called the Dix-Hallpike test, head thrust test, or roll test. You may be referred to a health care provider who specializes in ear, nose, and throat (ENT) problems (otolaryngologist) or a provider who specializes in disorders of the central nervous system (neurologist). You may have additional testing, including:  A physical exam.  Blood tests.  MRI.  A CT scan.  An electrocardiogram (ECG). This records electrical activity in your heart.  An electroencephalogram (EEG). This records electrical activity in your brain.  Hearing tests.  How is this treated? Treatment for this condition depends on the cause and the severity of the symptoms. Treatment options include:  Medicines to treat nausea or vertigo. These are usually used for severe cases. Some medicines that are used to treat other conditions may also reduce or eliminate vertigo symptoms. These include: ? Medicines that control allergies (antihistamines). ? Medicines that control seizures (anticonvulsants). ? Medicines that relieve depression (antidepressants). ? Medicines that relieve anxiety (sedatives).  Head movements to adjust your inner ear back to normal. If your vertigo is caused by an ear problem, your health care provider may recommend certain movements to correct the problem.  Surgery. This is rare.  Follow these instructions at home: Safety  Move slowly.Avoid sudden body or head movements.  Avoid driving.  Avoid operating heavy machinery.  Avoid doing any tasks that would cause danger to you or others if you would have a vertigo episode during the task.  If you have trouble walking or keeping your balance, try using a cane  for stability. If you feel dizzy or unstable, sit down right away.  Return to your normal activities as told by your health care provider. Ask your health care provider what activities are safe for you. General instructions  Take over-the-counter and prescription medicines only as told by your health care provider.  Avoid certain positions or movements as told by your health care provider.  Drink enough fluid to keep your urine clear or pale yellow.  Keep all follow-up visits as told by your health care provider. This is important. Contact a health care provider if:  Your medicines do not relieve your vertigo or they make it worse.  You have a fever.  Your condition gets worse or you develop new symptoms.  Your family or friends notice any behavioral changes.  Your nausea or vomiting gets worse.  You have numbness or a "pins and needles" sensation in part of your body. Get help right away if:  You have difficulty moving or speaking.  You are always dizzy.  You faint.  You develop severe headaches.  You have weakness in your hands, arms, or legs.  You have changes in your hearing or vision.  You develop a stiff neck.  You develop sensitivity to light. This information is not intended to replace advice given to you by your health care provider. Make sure you discuss any questions you have with your health care provider. Document Released: 09/07/2005 Document Revised: 05/11/2016 Document Reviewed: 03/23/2015 Elsevier Interactive Patient Education  2018 [employer].    Migraine Headache A migraine headache is a very strong throbbing pain on one side or both sides of your head. Migraines can also cause other symptoms. Talk with your doctor about what things may bring on (trigger) your migraine headaches. Follow these instructions at home: Medicines  Take over-the-counter and prescription medicines only as told by your doctor.  Do not drive or use heavy machinery  while taking prescription pain medicine.  To prevent or treat constipation while you are taking prescription pain medicine, your doctor may recommend that you: ? Drink enough fluid to keep your pee (urine) clear or pale yellow. ? Take over-the-counter or prescription medicines. ? Eat foods that are high in fiber. These include fresh fruits and vegetables, whole grains, and beans. ? Limit foods that are high in fat and processed sugars. These include fried and sweet foods. Lifestyle  Avoid alcohol.  Do not use any products that contain nicotine or tobacco, such as cigarettes and e-cigarettes. If you need help quitting, ask your doctor.  Get at least 8 hours of sleep every night.  Limit your stress. General instructions   Keep a journal to find out what may bring on your migraines. For example, write down: ? What you eat and drink. ? How much sleep you get. ? Any change in what you eat or drink. ? Any change in your medicines.  If you have a migraine: ? Avoid things that make your symptoms worse, such as bright lights. ? It may help to lie down in a dark, quiet room. ? Do not drive or use heavy machinery. ? Ask your doctor what activities are safe for you.  Keep all follow-up visits as told by your doctor. This is important. Contact a doctor if:  You  get a migraine that is different or worse than your usual migraines. Get help right away if:  Your migraine gets very bad.  You have a fever.  You have a stiff neck.  You have trouble seeing.  Your muscles feel weak or like you cannot control them.  You start to lose your balance a lot.  You start to have trouble walking.  You pass out (faint). This information is not intended to replace advice given to you by your health care provider. Make sure you discuss any questions you have with your health care provider. Document Released: 09/06/2008 Document Revised: 06/17/2016 Document Reviewed: 05/16/2016 Elsevier  Interactive Patient Education  2018 [employer].   Vasovagal Syncope, Adult Syncope, which is commonly known as fainting or passing out, is a temporary loss of consciousness. It occurs when the blood flow to the brain is reduced. Vasovagal syncope, also called neurocardiogenic syncope, is a fainting spell that happens when blood flow to the brain is reduced because of a sudden drop in heart rate and blood pressure. Vasovagal syncope is usually harmless. However, you can get injured if you fall during a fainting spell. What are the causes? This condition is caused by a drop in heart rate and blood pressure, usually in response to a trigger. Many things and situations can trigger an episode, including:  Pain.  Fear.  The sight of blood. This may occur during medical procedures, such as when blood is being drawn from a vein.  Common activities, such as coughing, swallowing, stretching, or going to the bathroom.  Emotional stress.  Being in a confined space.  Prolonged standing, especially in a warm environment.  Lack of sleep or rest.  Not eating for a long time.  Not drinking enough liquids.  Recent illness.  Drinking alcohol.  Taking drugs that affect blood pressure, such as marijuana, cocaine, opiates, or inhalants.  What are the signs or symptoms? Before a fainting episode, you may:  Feel dizzy or light-headed.  Become pale.  Sense that you are going to faint.  Feel like the room is spinning.  Only see directly ahead (tunnel vision).  Feel sick to your stomach (nauseous).  See spots.  Slowly lose vision.  Hear ringing in your ears.  Have a headache.  Feel warm and sweaty.  Feel a sensation of pins and needles.  During the fainting spell, you may twitch or make jerky movements. Fainting spells usually last no longer than a few minutes before you wake up. If you get up too quickly before your body can recover, you may faint again. How is this  diagnosed? This condition is diagnosed based on your symptoms, your medical history, and a physical exam. Tests may be done to rule out other causes of fainting. Tests may include:  Blood tests.  Heart tests, such as an electrocardiogram (ECG), echocardiogram, or electrophysiology study.  A test to check your response to changes in position (tilt table test).  How is this treated? Usually, treatment is not needed for this condition. Your health care provider may suggest ways to help prevent fainting episodes. These may include:  Drinking additional fluids if you are exposed to a trigger.  Sitting or lying down if you notice signs that an episode is coming.  If your fainting spells continue, your health care provider may recommend that you:  Take medicines to prevent fainting or to help reduce further episodes of fainting.  Do certain exercises.  Wear compression stockings.  Have surgery to  place a pacemaker in your body (rare).  Follow these instructions at home:  Learn to identify the signs that an episode is coming.  Sit or lie down at the first sign of a fainting spell. If you sit down, put your head down between your legs. If you lie down, swing your legs up in the air to increase blood flow to the brain.  Avoid hot tubs and saunas.  Avoid standing for a long time. If you have to stand for a long time, try: ? Crossing your legs. ? Flexing and stretching your leg muscles. ? Squatting. ? Moving your legs. ? Bending over.  Drink enough fluid to keep your urine clear or pale yellow.  Make changes to your diet that your health care provider recommends. You may be told to: ? Avoid caffeine. ? Eat more salt.  Take over-the-counter and prescription medicines only as told by your health care provider. Contact a health care provider if:  You continue to have fainting spells despite treatment.  You faint more often despite treatment.  You lose consciousness for more  than a few minutes.  You faint during or after exercising or after being startled.  You have twitching or jerky movements for longer than a few seconds during a fainting spell.  You have an episode of twitching or jerky movements without fainting. Get help right away if:  A fainting spell leads to an injury or bleeding.  You have new symptoms that occur with the fainting spells, such as: ? Shortness of breath. ? Chest pain. ? Irregular heartbeat.  You twitch or make jerky movements for more than 5 minutes.  You twitch or make jerky movements during more than one fainting spell. This information is not intended to replace advice given to you by your health care provider. Make sure you discuss any questions you have with your health care provider. Document Released: 11/14/2012 Document Revised: 05/11/2016 Document Reviewed: 09/26/2015 Elsevier Interactive Patient Education  [employer].

## 2018-08-28 DIAGNOSIS — R42 Dizziness and giddiness: Secondary | ICD-10-CM | POA: Insufficient documentation

## 2018-08-29 ENCOUNTER — Other Ambulatory Visit (HOSPITAL_COMMUNITY): Payer: Self-pay | Admitting: Internal Medicine

## 2018-08-29 ENCOUNTER — Telehealth: Payer: Self-pay | Admitting: Neurology

## 2018-08-29 DIAGNOSIS — R42 Dizziness and giddiness: Secondary | ICD-10-CM

## 2018-08-29 DIAGNOSIS — R519 Headache, unspecified: Secondary | ICD-10-CM

## 2018-08-29 DIAGNOSIS — R51 Headache: Principal | ICD-10-CM

## 2018-08-29 NOTE — Telephone Encounter (Signed)
The MRI Brain w/wo contrast has already been approved by Dr. Dagmar Hait office I was not able to get authorization on that exam..  I started the case for the MRA's it is pending [employer] I faxed clinical notes.

## 2018-08-29 NOTE — Telephone Encounter (Signed)
Desiree Gardner with Dr. Dagmar Hait office called and we have her MRI's/MRA's sedated at [facility] for 09/25/18.Anderson Malta was going to contact the patient to inform her of this.. She also stated that Dr. Dagmar Hait is going to fill out the sedated form for the patient.Anderson Malta phone number is [phone] ext. [phone].

## 2018-08-29 NOTE — Telephone Encounter (Signed)
Left a message with the lady that does the auth for MRI at Dr. Dagmar Hait office to call me back about the MRI Brain.

## 2018-08-30 ENCOUNTER — Ambulatory Visit: Payer: Managed Care, Other (non HMO) | Attending: Internal Medicine | Admitting: Physical Therapy

## 2018-08-30 ENCOUNTER — Other Ambulatory Visit: Payer: Self-pay

## 2018-08-30 ENCOUNTER — Encounter: Payer: Self-pay | Admitting: Physical Therapy

## 2018-08-30 VITALS — BP 118/76

## 2018-08-30 DIAGNOSIS — R42 Dizziness and giddiness: Secondary | ICD-10-CM | POA: Insufficient documentation

## 2018-08-30 DIAGNOSIS — M542 Cervicalgia: Secondary | ICD-10-CM | POA: Diagnosis present

## 2018-08-30 DIAGNOSIS — R2681 Unsteadiness on feet: Secondary | ICD-10-CM | POA: Insufficient documentation

## 2018-08-30 NOTE — Therapy (Signed)
[facility] [address] [place], [place], [address] Phone: [phone]   Fax:  [phone]  Physical Therapy Evaluation  Patient Details  Name: Desiree Gardner MRN: [id number] Date of Birth: 10-Jan-1966 Referring Provider: Sarina Ill MD   Encounter Date: 08/30/2018   08/30/18 1540  PT Visits / Re-Eval  Visit Number 1  Number of Visits 12  Date for PT Re-Evaluation 10/14/18  Authorization  Authorization Type [employer] Managed   PT Time Calculation  PT Start Time 1400  PT Stop Time 1456  PT Time Calculation (min) 56 min  PT - End of Session  Activity Tolerance Other (comment) (Tolerated session well with mild c/o of dizziness and nausea with dynamic visual acuity assessment)  Behavior During Therapy Anxious      Past Medical History:  Diagnosis Date  . Headache    Dr. Domingo Cocking  . IC (intermittent claudication) ([place])   . Plantar fasciitis   . PNA (pneumonia) 2011  . Syncope 07/2011   fall, under stress  . Vertigo     Past Surgical History:  Procedure Laterality Date  . BREAST REDUCTION SURGERY  11/1986  . BUNIONECTOMY  11/2000    Vitals:   08/30/18 1406  BP: 118/76      08/30/18 1406  Symptoms/Limitations  Subjective Patient reports her neurologists believes vestibular migraines are causing her issues and can be resolved with physical therapy. Vertigo started 1.5 years ago when driving and she felt the sensation of a "wave" causing instability. Pt reports her sx were controlled with exercise, stress, and diet. Friday before labor day she went to her sons soccer game in the heat and felt she was going to pass out and lost focus in which her doctors believed to be a vaso-vagal response. Flew to [place] last week and at one of her meetings she went to the bathroom and lost her balance with veering to her right. Experienced vertigo causing her to hold on to the table for support and symptoms worsened when  resting her head on table. Symptoms relieved when placing her head in her hand resting on chair arm rest. Pt reports dull headache in occipital region to cervical region with light sensitivity ever since [place]. Pt reports having headache for long period of time with no abolishment of symptoms. Pt reports son is her greatest source of stress. Pt reports no changes with hearing since onset and experiences dull form of dizziness that she feels at all times. Pt denies double vision. Pt reports tearing L cornea in December. Pt reports inability to drive on highways due to strong dizziness sensation. Pt questions whether symptoms may be related to IUD placement and is unsure about what triggers her dizziness. No reports of falls but does report occassional bruising to hips and legs with no understanding of what may have happened.    Pertinent History intermittent claudication, plantar fasciitis, syncope, vertigo, PNA  Diagnostic tests Scheduled for MRI under sedation in October to rule out vascular causes or tumor, CT was WNL  Patient Stated Goals To get better and be able to drive again  Pain Assessment  Currently in Pain? Yes  Pain Score 2  Pain Location Head (base of skull)  Pain Orientation  (base of skull near occipital region)  Pain Descriptors / Indicators Aching  Pain Type Chronic pain  Pain Radiating Towards cervical region with neck stiffness   Pain Onset More than a month ago  Pain Frequency Constant  Aggravating Factors  unsure  about triggering factors   Pain Relieving Factors Aspirin   Effect of Pain on Daily Activities Has recently stopped exercising since vertigo exacerbations causing instability and issues with safety. Unable to drive on highways and has to call a car service to drive her to work.   Multiple Pain Sites No     08/30/18 1420  Assessment  Medical Diagnosis Vertigo   Referring Provider Sarina Ill MD  Onset Date/Surgical Date 08/28/18  Hand Dominance  (Did not  specify )  Precautions  Precautions Fall  Restrictions  Weight Bearing Restrictions No  Balance Screen  Has the patient fallen in the past 6 months No  Has the patient had a decrease in activity level because of a fear of falling?  Yes (Recently stopped exercising; unable to drive)  Is the patient reluctant to leave their home because of a fear of falling?  No  Home Environment  Living Environment Other (Comment)  Additional Comments Did not formally assess home environment  Prior Function  Level of Independence Independent  Vocation Other (comment) (Unsure about part time vs. 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   Leisure enjoys going to her son's soccer games   Cognition  Overall Cognitive Status Within Functional Limits for tasks assessed  Coordination  Gross Motor Movements are Fluid and Coordinated Yes  Fine Motor Movements are Fluid and Coordinated Yes  Finger Nose Finger Test R= L with both WNL  Heel Shin Test R=L with both WNL  Posture/Postural Control  Posture/Postural Control Postural limitations  Posture Comments Pt reports increased cervical stiffness that may be causing her to guard cervical region with potential increased activation of bilateral traps. Requires further assessment  ROM / Strength  AROM / PROM / Strength AROM  AROM  Overall AROM  Deficits  AROM Assessment Site Cervical (All ROM assessed with inclinometer )  Cervical Flexion WNL  Cervical Extension 35 degrees  Cervical - Right Side Bend 20 degrees   Cervical - Left Side Bend 20 degrees  Cervical - Right Rotation 41 degrees  Cervical - Left Rotation 40 degrees     08/30/18 1434  Vestibular Assessment  General Observation Pt very anxious; holding shoulders and neck very guarded  Symptom Behavior  Type of Dizziness Spinning ("waves" overcome her causing LOB)  Frequency of Dizziness Pt reports experiencing constant dull dizzines with worsening of  symptoms creating vertigo and spinning sensations with no known triggers. Constant dull dizziness began after her vertigo episode in [place] and symptoms have not resolved since.  Pt does report she is under a lot of stress.   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                     Objective measurements completed on examination: See above findings.      08/30/18 1447  PT Education  Education Details Clinical findings with Pt demonstrating signs and symptoms consistent with R  sided uncompensated hypofuction with worsening symptoms with increased visual stimulation. Therapist provided education regarding POC and handout with information on vestibular disorders for patient reference. Educated on continuation with endurance exercise such as biking and/or treadmill.   Person(s) Educated Patient  Methods Explanation  Comprehension Verbalized understanding                PT Short Term Goals - 08/30/18 1553      PT SHORT TERM GOAL #1   Title  Pt will be independent with performing HEP to further progress towards primary goal of increasing independence with functional mobility    Baseline  dependent     Time  3    Period  Weeks    Status  New    Target Date  09/20/18      PT SHORT TERM GOAL #2   Title  Pt will participate in functional gait assessment to determine fall risk  potential during functional mobility     Time  3    Period  Weeks    Status  New    Target Date  09/20/18      PT SHORT TERM GOAL #3   Title  Pt will participate in gait speed assessment to determine baseline functional level     Time  3    Period  Weeks    Status  New    Target Date  09/20/18      PT SHORT TERM GOAL #4   Title  Pt will tolerate x1 viewing in standing to train VOR     Time  3    Period  Weeks    Status  New    Target Date  09/20/18      PT SHORT TERM GOAL #5   Title  Pt will increase overall cervical ROM by 5 degrees demonstrating improved ROM and reduced stiffness.     Baseline  report back to ROM findings under assessment tab     Time  3    Period  Weeks    Status  New    Target Date  09/20/18      Additional Short Term Goals   Additional Short Term Goals  Yes      PT SHORT TERM GOAL #6   Title  Pt will participate and tolerate a dynamic visual acuity assessment to further assess vestibular hypofunction    Time  3    Period  Weeks    Status  New    Target Date  09/20/18        PT Long Term Goals - 08/30/18 1600      PT LONG TERM GOAL #1   Title  Pt will demonstrate independence with final HEP    Time  6    Period  Weeks    Status  New    Target Date  10/14/18      PT LONG TERM GOAL #2   Title  Pt will demonstrate 10-12 deg overall improvement in cervical spine ROM with reports of abolishment of occipital headaches     Time  6    Period  Weeks    Status  New    Target Date  10/14/18      PT LONG TERM GOAL #3   Title  Pt will report 0/10 dizziness when performing head turns and changes in direction during gait demonstrating improvement in functional mobility     Time  6    Period  Weeks  Status  New    Target Date  10/14/18      PT LONG TERM GOAL #4   Title  Pt will improve gait speed time to >4.37 ft/sec indicating normal walking speed with no reports of dizziness or headaches     Time  6    Period  Weeks    Status  New     Target Date  10/14/18      PT LONG TERM GOAL #5   Title  Pt will improve FGA score to >/= to 25/30 to demonstrate low fall risk potential     Time  6    Period  Weeks    Status  New    Target Date  10/14/18      Additional Long Term Goals   Additional Long Term Goals  Yes      PT LONG TERM GOAL #6   Title  Pt will report ability to drive for short distances around neighborhood with no complaints of dizziness or onset of headaches demonstrating improvement in functional independence    Time  6    Period  Weeks    Status  New    Target Date  10/14/18      PT LONG TERM GOAL #7   Title  Pt will demonstrate a 2-3 line difference in the dynamic visual acuity assessment indicating improvement in VOR    Time  6    Period  Weeks    Status  New    Target Date  10/14/18         08/30/18 1542  Plan  Clinical Impression Statement Pt is a 52 year old female referred to Neuro OPPT for evaluation of vertigo. Pt's PMH includes intermittent claudication, plantar fasciitis, syncope, vertigo, and PNA. The following deficits were noted during pt's exam: increased shoulder and neck mm guarding, pain in neck, decreased cervical spine ROM, impaired balance, left beating nystagmus with left gaze with positive right head impulse test.  Based on these clinical findings, patient demonstrates signs and symptoms consistent with right sided uncompensated hypofunction.  Pt did demonstrate corrective saccades with VOR cancellation but lacks other central signs; pt will undergo MRI with sedation in October to rule out central causes of vertigo (CVA, vascular, tumor) - therapy will continue to monitor for changes in status and central red flags.  Pt would benefit from skilled PT to address these impairments and functional limitations to maximize functional mobility independence.   History and Personal Factors relevant to plan of care: PMH: intermittent claudication, plantar fasciitis, syncope, vertigo, PNA; Inability to  drive on highways 2/2 "waves of unsteadiness", still awaiting imaging to rule out vascular cause or tumor; having to cut work hours and work more from home, decreasing functional independence, history of vertigo, constant dull headaches and dizziness with no abolishment of symptoms  Clinical Presentation Evolving  Clinical Presentation due to: PMH: intermittent claudication, plantar fasciitis, syncope, vertigo, PNA; Inability to drive on highways 2/2 "waves of unsteadiness", still awaiting imaging to rule out vascular cause or tumor; having to cut work hours and work more from home, decreasing functional independence, history of vertigo, constant dull headaches and dizziness with no abolishment of symptoms  Clinical Decision Making Moderate  Pt will benefit from skilled therapeutic intervention in order to improve on the following deficits Dizziness;Decreased range of motion;Decreased balance;Difficulty walking;Pain;Postural dysfunction  Rehab Potential Good  PT Frequency 2x / week  PT Duration 6 weeks  PT Treatment/Interventions ADLs/Self Care Home Management;Gait training;Neuromuscular  re-education;Passive range of motion;Stair training;Functional mobility training;Therapeutic activities;Patient/family education;Therapeutic exercise;Balance training;Vestibular;Canalith Repostioning;Taping;Dry needling;Manual techniques  PT Next Visit Plan assess gait velocity and FGA; teach gaze stabilization & habituation exercises, multi-sensory balance training; C-spine exercises and stretches  PT Home Exercise Plan tbd  Consulted and Agree with Plan of Care Patient            Patient will benefit from skilled therapeutic intervention in order to improve the following deficits and impairments:  Dizziness, Decreased range of motion, Decreased balance, Difficulty walking, Pain, Postural dysfunction  Visit Diagnosis: Unsteadiness on feet  Dizziness and giddiness  Cervicalgia     Problem  List Patient Active Problem List   Diagnosis Date Noted  . Vertigo 08/28/2018  . GERD 08/17/2007   Rico Junker, PT, DPT 09/02/18    5:20 PM    [facility] [address] [place], [place], [address] Phone: [phone]   Fax:  [phone]  Name: Oliana Gowens MRN: [id number] Date of Birth: 05/02/1966

## 2018-08-30 NOTE — Telephone Encounter (Signed)
I called [facility] they did receive the fax of clinical notes and it is still pending.

## 2018-08-31 NOTE — Telephone Encounter (Signed)
Desiree Gardner: [id number] (exp. 08/29/18 to 11/27/18) patient is scheduled at [facility] for 09/25/18 for sedated.

## 2018-09-03 ENCOUNTER — Ambulatory Visit: Payer: Managed Care, Other (non HMO) | Admitting: Physical Therapy

## 2018-09-03 DIAGNOSIS — R2681 Unsteadiness on feet: Secondary | ICD-10-CM

## 2018-09-03 DIAGNOSIS — R42 Dizziness and giddiness: Secondary | ICD-10-CM

## 2018-09-03 NOTE — Therapy (Signed)
[facility] [address] [place], [place], [address] Phone: [phone]   Fax:  [phone]  Physical Therapy Treatment  Patient Details  Name: Desiree Gardner MRN: [id number] Date of Birth: Apr 28, 1966 Referring Provider: Sarina Ill MD   Encounter Date: 09/03/2018  PT End of Session - 09/03/18 1913    Visit Number  2    Number of Visits  12    Date for PT Re-Evaluation  10/14/18    Authorization Type  [employer] Managed     PT Start Time  0932    PT Stop Time  1020    PT Time Calculation (min)  48 min       Past Medical History:  Diagnosis Date  . Headache    Dr. Domingo Cocking  . IC (intermittent claudication) ([place])   . Plantar fasciitis   . PNA (pneumonia) 2011  . Syncope 07/2011   fall, under stress  . Vertigo     Past Surgical History:  Procedure Laterality Date  . BREAST REDUCTION SURGERY  11/1986  . BUNIONECTOMY  11/2000    There were no vitals filed for this visit.  Subjective Assessment - 09/03/18 1857    Subjective  Pt states she saw her eye doctor (sustained torn cornea in Lt eye in Dec. 2018) and he said she may have to undergo scraping of her eye for this in the future;  pt asks what can she do to get better? States she is now noticing that she gets very winded with activity lately - doesn't know why she is getting short of breath with activity     Pertinent History  intermittent claudication, plantar fasciitis, syncope, vertigo, PNA    Diagnostic tests  Scheduled for MRI under sedation in October to rule out vascular causes or tumor, CT was WNL    Patient Stated Goals  To get better and be able to drive again    Currently in Pain?  Yes    Pain Score  2     Pain Location  Head    Pain Orientation  Posterior    Pain Descriptors / Indicators  Aching    Pain Type  Chronic pain    Pain Onset  More than a month ago    Pain Frequency  Constant                        Vestibular  Treatment/Exercise - 09/03/18 0001      Vestibular Treatment/Exercise   Gaze Exercises  X1 Viewing Horizontal;X1 Viewing Vertical      X1 Viewing Horizontal   Foot Position  bil. stance     Time  --   1"   Reps  1    Comments  more c/o vertigo reported with horizontal than with vertical      X1 Viewing Vertical   Foot Position  bil. stance    Time  --   60 secs   Reps  1    Comments  less c/o vertigo with vertical than with horizontal      Static visual acuity - line 10:  Dynamic visual acuity line 7 (slightly abnormal as a 3 line difference)     Balance Exercises - 09/03/18 1910      Balance Exercises: Standing   Standing Eyes Opened  Narrow base of support (BOS);Wide (BOA);Head turns;Foam/compliant surface;5 reps    Standing Eyes Closed  Narrow base of support (BOS);Wide (BOA);Head turns;Foam/compliant surface;5 reps  PT Education - 09/03/18 1911    Education Details  Instructed in x 1 viewing in standing; balance on foam (feet apart/together and EO/EC; instructed pt to perform head turns when amb. with husband; emphasized importance of hydration    Person(s) Educated  Patient    Methods  Explanation;Demonstration;Handout    Comprehension  Verbalized understanding;Returned demonstration       PT Short Term Goals - 08/30/18 1553      PT SHORT TERM GOAL #1   Title  Pt will be independent with performing HEP to further progress towards primary goal of increasing independence with functional mobility    Baseline  dependent     Time  3    Period  Weeks    Status  New    Target Date  09/20/18      PT SHORT TERM GOAL #2   Title  Pt will participate in functional gait assessment to determine fall risk potential during functional mobility     Time  3    Period  Weeks    Status  New    Target Date  09/20/18      PT SHORT TERM GOAL #3   Title  Pt will participate in gait speed assessment to determine baseline functional level     Time  3    Period  Weeks     Status  New    Target Date  09/20/18      PT SHORT TERM GOAL #4   Title  Pt will tolerate x1 viewing in standing to train VOR     Time  3    Period  Weeks    Status  New    Target Date  09/20/18      PT SHORT TERM GOAL #5   Title  Pt will increase overall cervical ROM by 5 degrees demonstrating improved ROM and reduced stiffness.     Baseline  report back to ROM findings under assessment tab     Time  3    Period  Weeks    Status  New    Target Date  09/20/18      Additional Short Term Goals   Additional Short Term Goals  Yes      PT SHORT TERM GOAL #6   Title  Pt will participate and tolerate a dynamic visual acuity assessment to further assess vestibular hypofunction    Time  3    Period  Weeks    Status  New    Target Date  09/20/18        PT Long Term Goals - 08/30/18 1600      PT LONG TERM GOAL #1   Title  Pt will demonstrate independence with final HEP    Time  6    Period  Weeks    Status  New    Target Date  10/14/18      PT LONG TERM GOAL #2   Title  Pt will demonstrate 10-12 deg overall improvement in cervical spine ROM with reports of abolishment of occipital headaches     Time  6    Period  Weeks    Status  New    Target Date  10/14/18      PT LONG TERM GOAL #3   Title  Pt will report 0/10 dizziness when performing head turns and changes in direction during gait demonstrating improvement in functional mobility     Time  6    Period  Weeks    Status  New    Target Date  10/14/18      PT LONG TERM GOAL #4   Title  Pt will improve gait speed time to >4.37 ft/sec indicating normal walking speed with no reports of dizziness or headaches     Time  6    Period  Weeks    Status  New    Target Date  10/14/18      PT LONG TERM GOAL #5   Title  Pt will improve FGA score to >/= to 25/30 to demonstrate low fall risk potential     Time  6    Period  Weeks    Status  New    Target Date  10/14/18      Additional Long Term Goals   Additional Long Term  Goals  Yes      PT LONG TERM GOAL #6   Title  Pt will report ability to drive for short distances around neighborhood with no complaints of dizziness or onset of headaches demonstrating improvement in functional independence    Time  6    Period  Weeks    Status  New    Target Date  10/14/18      PT LONG TERM GOAL #7   Title  Pt will demonstrate a 2-3 line difference in the dynamic visual acuity assessment indicating improvement in VOR    Time  6    Period  Weeks    Status  New    Target Date  10/14/18            Plan - 09/03/18 1914    Clinical Impression Statement  Pt has symptoms consistent with vestibular hypofunction with pt reporting > c/o dizziness with horizontal head turns than with vertical head turns.  Pt did have increased postural sway with standing on pillow with EC with head turns.  Pt's DVA slightly abnormal with a 3 line difference - line 10 static/ line 7 dynamic.  Pt appears anxious during session but also appears very motivated to improve current functional status.                                                                                                       PT Frequency  2x / week    PT Duration  6 weeks    PT Treatment/Interventions  ADLs/Self Care Home Management;Gait training;Neuromuscular re-education;Passive range of motion;Stair training;Functional mobility training;Therapeutic activities;Patient/family education;Therapeutic exercise;Balance training;Vestibular;Canalith Repostioning;Taping;Dry needling;Manual techniques    PT Next Visit Plan  assess gait velocity and FGA; do SOT:  check HEP - x1 viewing and balance on foam; do [place]    PT Home Exercise Plan  x1 viewing and balance on foam    Consulted and Agree with Plan of Care  Patient       Patient will benefit from skilled therapeutic intervention in order to improve the following deficits and impairments:  Dizziness, Decreased range of motion, Decreased balance, Difficulty walking, Pain, Postural  dysfunction  Visit Diagnosis: Dizziness and giddiness  Unsteadiness on feet  Problem List Patient Active Problem List   Diagnosis Date Noted  . Vertigo 08/28/2018  . GERD 08/17/2007    Alda Lea, PT 09/03/2018, 7:32 PM  [facility] [address] [place], [place], [address] Phone: [phone]   Fax:  [phone]  Name: Deasha Clendenin MRN: [id number] Date of Birth: 01-10-1966

## 2018-09-07 ENCOUNTER — Other Ambulatory Visit (HOSPITAL_COMMUNITY): Payer: Self-pay | Admitting: Internal Medicine

## 2018-09-07 DIAGNOSIS — R519 Headache, unspecified: Secondary | ICD-10-CM

## 2018-09-07 DIAGNOSIS — R42 Dizziness and giddiness: Secondary | ICD-10-CM

## 2018-09-07 DIAGNOSIS — R51 Headache: Principal | ICD-10-CM

## 2018-09-10 ENCOUNTER — Ambulatory Visit: Payer: Managed Care, Other (non HMO) | Admitting: Physical Therapy

## 2018-09-10 ENCOUNTER — Encounter: Payer: Self-pay | Admitting: Physical Therapy

## 2018-09-10 DIAGNOSIS — R42 Dizziness and giddiness: Secondary | ICD-10-CM

## 2018-09-10 DIAGNOSIS — R2681 Unsteadiness on feet: Secondary | ICD-10-CM

## 2018-09-10 NOTE — Therapy (Signed)
[facility] [address] [place], [place], [address] Phone: [phone]   Fax:  [phone]  Physical Therapy Treatment  Patient Details  Name: Desiree Gardner MRN: [id number] Date of Birth: [birth year] Referring Provider (PT): Sarina Ill MD   Encounter Date: 09/10/2018  PT End of Session - 09/10/18 1624    Visit Number  3    Number of Visits  12    Date for PT Re-Evaluation  10/14/18    Authorization Type  [employer] Managed     PT Start Time  0802    PT Stop Time  0846    PT Time Calculation (min)  44 min    Activity Tolerance  Other (comment)   pt unable to complete SOT due to c/o dizziness with moving surround   Behavior During Therapy  Anxious       Past Medical History:  Diagnosis Date  . Headache    Dr. Domingo Cocking  . IC (intermittent claudication) ([place])   . Plantar fasciitis   . PNA (pneumonia) 2011  . Syncope 07/2011   fall, under stress  . Vertigo     Past Surgical History:  Procedure Laterality Date  . BREAST REDUCTION SURGERY  11/1986  . BUNIONECTOMY  11/2000    There were no vitals filed for this visit.  Subjective Assessment - 09/10/18 1612    Subjective  Pt states she has MRI scheduled on 09-25-18; states she continues to feel that her problem is a blood flow issue; pt does report that she feels she is a little better overall - is not as nauseous as she was and also not as SOB as she was at time of last week's session  - has been walking with her husband     Pertinent History  intermittent claudication, plantar fasciitis, syncope, vertigo, PNA    Diagnostic tests  Scheduled for MRI under sedation in October to rule out vascular causes or tumor, CT was WNL    Patient Stated Goals  To get better and be able to drive again    Currently in Pain?  Other (Comment)          Sensory Organization Test attempted -pt completed conditions 1 and 2 but unable to tolerate condition 3 with moving  surround  Condition 1 - trials 1 and 2 WNL's; condition 3 below N Condition 2 - all 3 trials below N Condition 3 - FALL on trials 1 and 2;  Trial 3 not scored Condition 4 - 6 - Not scored due to pt unable to tolerate testing    Self Care; discussed HEP and current exercise program to increase mobility - pt states she is walking with her husband, incorporating increased head turns during amb. Pt states she is riding bike at gym approx. 20" - 30" and is drinking lots of water for hydration         Vestibular Treatment/Exercise - 09/10/18 0001      Vestibular Treatment/Exercise   Vestibular Treatment Provided  Gaze   Reviewed x1 viewing (HEP)   Gaze Exercises  X1 Viewing Horizontal;X1 Viewing Vertical      X1 Viewing Horizontal   Foot Position  bil. stance     Reps  1    Comments  60 secs - pt needed tactile cues to hold shoulders still for isolated head movement      X1 Viewing Vertical   Foot Position  bil. stance    Reps  1  Comments  pt reported seeing spots after completion of this exercise for 60 secs; - subsided after approx. 1"          Balance Exercises - 09/10/18 1615      Balance Exercises: Standing   Standing Eyes Opened  Narrow base of support (BOS);Wide (BOA);Head turns;Foam/compliant surface;5 reps    Standing Eyes Closed  Wide (BOA);Head turns;Foam/compliant surface;5 reps    Other Standing Exercises  Pt performed exercise of visually tracking ball - making circles clockwise, counterclockwise, "+" and "v" with patterned, colorful ball; 5 reps each direction - pt had difficulty maintaining balance on foam with this activity          PT Education - 09/10/18 1622    Education Details  Added standing on foam - tracking ball clockwise, counterclockwise , "+" and "v" patterns for improved VOR and balance with head turns    Person(s) Educated  Patient    Methods  Explanation;Other (comment)   emailed these instructions to pt as printer not working during  time of her PT appt   Comprehension  Verbalized understanding;Returned demonstration       PT Short Term Goals - 08/30/18 1553      PT SHORT TERM GOAL #1   Title  Pt will be independent with performing HEP to further progress towards primary goal of increasing independence with functional mobility    Baseline  dependent     Time  3    Period  Weeks    Status  New    Target Date  09/20/18      PT SHORT TERM GOAL #2   Title  Pt will participate in functional gait assessment to determine fall risk potential during functional mobility     Time  3    Period  Weeks    Status  New    Target Date  09/20/18      PT SHORT TERM GOAL #3   Title  Pt will participate in gait speed assessment to determine baseline functional level     Time  3    Period  Weeks    Status  New    Target Date  09/20/18      PT SHORT TERM GOAL #4   Title  Pt will tolerate x1 viewing in standing to train VOR     Time  3    Period  Weeks    Status  New    Target Date  09/20/18      PT SHORT TERM GOAL #5   Title  Pt will increase overall cervical ROM by 5 degrees demonstrating improved ROM and reduced stiffness.     Baseline  report back to ROM findings under assessment tab     Time  3    Period  Weeks    Status  New    Target Date  09/20/18      Additional Short Term Goals   Additional Short Term Goals  Yes      PT SHORT TERM GOAL #6   Title  Pt will participate and tolerate a dynamic visual acuity assessment to further assess vestibular hypofunction    Time  3    Period  Weeks    Status  New    Target Date  09/20/18        PT Long Term Goals - 08/30/18 1600      PT LONG TERM GOAL #1   Title  Pt will demonstrate independence with final HEP  Time  6    Period  Weeks    Status  New    Target Date  10/14/18      PT LONG TERM GOAL #2   Title  Pt will demonstrate 10-12 deg overall improvement in cervical spine ROM with reports of abolishment of occipital headaches     Time  6    Period   Weeks    Status  New    Target Date  10/14/18      PT LONG TERM GOAL #3   Title  Pt will report 0/10 dizziness when performing head turns and changes in direction during gait demonstrating improvement in functional mobility     Time  6    Period  Weeks    Status  New    Target Date  10/14/18      PT LONG TERM GOAL #4   Title  Pt will improve gait speed time to >4.37 ft/sec indicating normal walking speed with no reports of dizziness or headaches     Time  6    Period  Weeks    Status  New    Target Date  10/14/18      PT LONG TERM GOAL #5   Title  Pt will improve FGA score to >/= to 25/30 to demonstrate low fall risk potential     Time  6    Period  Weeks    Status  New    Target Date  10/14/18      Additional Long Term Goals   Additional Long Term Goals  Yes      PT LONG TERM GOAL #6   Title  Pt will report ability to drive for short distances around neighborhood with no complaints of dizziness or onset of headaches demonstrating improvement in functional independence    Time  6    Period  Weeks    Status  New    Target Date  10/14/18      PT LONG TERM GOAL #7   Title  Pt will demonstrate a 2-3 line difference in the dynamic visual acuity assessment indicating improvement in VOR    Time  6    Period  Weeks    Status  New    Target Date  10/14/18            Plan - 09/10/18 1625    Clinical Impression Statement  Pt unable to tolerate SOT with c/o excessive dizziness with moving surround on condition 3;  pt has most difficulty with activities requiring increased visual input and also with standing with EC on compliant surfaces.  Test was discontinued after 2 attempts on condition 3 due to pt stating she was unable to tolerate due to excessive dizziness provoked with testing (with walls moving).  Pt reporting some improvement in functional status overall with pt stating she is now walking more with her husband and reports she is not getting SOB as she was at time of  last week's PT session.  Pt reports attempting to increase horizontal head turns during walking.     Rehab Potential  Good    PT Frequency  2x / week    PT Duration  6 weeks    PT Treatment/Interventions  ADLs/Self Care Home Management;Gait training;Neuromuscular re-education;Passive range of motion;Stair training;Functional mobility training;Therapeutic activities;Patient/family education;Therapeutic exercise;Balance training;Vestibular;Canalith Repostioning;Taping;Dry needling;Manual techniques    PT Next Visit Plan  assess gait velocity and FGA - not done on 09-10-18 due to SOT was  attempted;     PT Home Exercise Plan  x1 viewing and balance on foam; added tracking ball in various directions while standing on pillow    Consulted and Agree with Plan of Care  Patient       Patient will benefit from skilled therapeutic intervention in order to improve the following deficits and impairments:  Dizziness, Decreased range of motion, Decreased balance, Difficulty walking, Pain, Postural dysfunction  Visit Diagnosis: Dizziness and giddiness  Unsteadiness on feet     Problem List Patient Active Problem List   Diagnosis Date Noted  . Vertigo 08/28/2018  . GERD 08/17/2007    DildayJenness Corner, PT 09/10/2018, 4:44 PM  [facility] [address] [place], [place], [address] Phone: [phone]   Fax:  [phone]  Name: Desiree Gardner MRN: [id number] Date of Birth: 04-Sep-1966

## 2018-09-10 NOTE — Patient Instructions (Addendum)
Stand on pillows in corner - make circles clockwise, counterclockwise, "V" and "+" patterns - 10 reps each direction  Plain ball - wrap in busy wrapping paper for more visually stimulating object

## 2018-09-14 ENCOUNTER — Ambulatory Visit: Payer: Managed Care, Other (non HMO) | Attending: Internal Medicine

## 2018-09-14 DIAGNOSIS — R42 Dizziness and giddiness: Secondary | ICD-10-CM | POA: Diagnosis not present

## 2018-09-14 DIAGNOSIS — R2681 Unsteadiness on feet: Secondary | ICD-10-CM | POA: Diagnosis present

## 2018-09-14 DIAGNOSIS — M542 Cervicalgia: Secondary | ICD-10-CM | POA: Insufficient documentation

## 2018-09-14 NOTE — Therapy (Signed)
[facility] [address] [place], [place], [address] Phone: [phone]   Fax:  [phone]  Physical Therapy Treatment  Patient Details  Name: Desiree Gardner MRN: [id number] Date of Birth: 1966-06-25 Referring Provider (PT): Sarina Ill MD   Encounter Date: 09/14/2018  PT End of Session - 09/14/18 1054    Visit Number  4    Number of Visits  12    Date for PT Re-Evaluation  10/14/18    Authorization Type  [employer] Managed     PT Start Time  0805    PT Stop Time  7017    PT Time Calculation (min)  39 min    Equipment Utilized During Treatment  Gait belt    Activity Tolerance  Patient tolerated treatment well;Other (comment)   pt did required several standing rest breaks during FGA 2/2 dizziness   Behavior During Therapy  WFL for tasks assessed/performed       Past Medical History:  Diagnosis Date  . Headache    Dr. Domingo Cocking  . IC (intermittent claudication) ([place])   . Plantar fasciitis   . PNA (pneumonia) 2011  . Syncope 07/2011   fall, under stress  . Vertigo     Past Surgical History:  Procedure Laterality Date  . BREAST REDUCTION SURGERY  11/1986  . BUNIONECTOMY  11/2000    There were no vitals filed for this visit.  Subjective Assessment - 09/14/18 0807    Subjective  Pt is having an MRA on 09/25/18 not an MRI. Pt reported she feels better and has been performing HEP and progressed gaze stabilization to one minute. Pt feels that the heat is making is worse so she makes sure she's staying hydrated. The HAs are improving but still present. Pt has been doing bike cardio for 35 mintues a day.     Pertinent History  intermittent claudication, plantar fasciitis, syncope, vertigo, PNA    Diagnostic tests  Scheduled for MRI under sedation in October to rule out vascular causes or tumor, CT was WNL    Patient Stated Goals  To get better and be able to drive again    Currently in Pain?  Yes    Pain Score  3     3-4/10   Pain Location  Neck    Pain Orientation  Posterior    Pain Descriptors / Indicators  Burning    Pain Onset  More than a month ago    Pain Frequency  Constant    Aggravating Factors   being still and poor posture    Pain Relieving Factors  improved posture and exercises         [facility] PT Assessment - 09/14/18 0812      Functional Gait  Assessment   Gait assessed   Yes    Gait Level Surface  Walks 20 ft in less than 5.5 sec, no assistive devices, good speed, no evidence for imbalance, normal gait pattern, deviates no more than 6 in outside of the 12 in walkway width.    Change in Gait Speed  Able to smoothly change walking speed without loss of balance or gait deviation. Deviate no more than 6 in outside of the 12 in walkway width.    Gait with Horizontal Head Turns  Performs head turns smoothly with slight change in gait velocity (eg, minor disruption to smooth gait path), deviates 6-10 in outside 12 in walkway width, or uses an assistive device.    Gait with Vertical  Head Turns  Performs task with slight change in gait velocity (eg, minor disruption to smooth gait path), deviates 6 - 10 in outside 12 in walkway width or uses assistive device    Gait and Pivot Turn  Turns slowly, requires verbal cueing, or requires several small steps to catch balance following turn and stop    Step Over Obstacle  Is able to step over 2 stacked shoe boxes taped together (9 in total height) without changing gait speed. No evidence of imbalance.    Gait with Narrow Base of Support  Ambulates less than 4 steps heel to toe or cannot perform without assistance.    Gait with Eyes Closed  Cannot walk 20 ft without assistance, severe gait deviations or imbalance, deviates greater than 15 in outside 12 in walkway width or will not attempt task.    Ambulating Backwards  Walks 20 ft, slow speed, abnormal gait pattern, evidence for imbalance, deviates 10-15 in outside 12 in walkway width.    Steps  Alternating  feet, no rail.    Total Score  18    FGA comment:  18/30: indicates pt is at a high falls risk. Pt reported dizziness incr. to 3/10 during eyes closed and backwards amb. Described as wooziness.                    [facility] Adult PT Treatment/Exercise - 09/14/18 0814      Ambulation/Gait   Ambulation/Gait  Yes    Ambulation/Gait Assistance  7: Independent    Ambulation Distance (Feet)  75 Feet    Assistive device  None    Gait Pattern  Step-through pattern;Within Functional Limits    Ambulation Surface  Level;Indoor    Gait velocity  4.15ft/sec no AD          Balance Exercises - 09/14/18 0827      Balance Exercises: Standing   Rockerboard  Lateral;Anterior/posterior;Head turns;EO;EC;10 seconds;30 seconds;5 reps   short rockerboard, lateral static standing and wt shifting   Other Standing Exercises  Performed in // bars with intermittent UE support and min guard to min A for safety.         PT Education - 09/14/18 1053    Education Details  PT discussed outcome measure results with pt. PT encouraged pt to continue current HEP, as pt is improving.     Person(s) Educated  Patient    Methods  Explanation    Comprehension  Verbalized understanding       PT Short Term Goals - 08/30/18 1553      PT SHORT TERM GOAL #1   Title  Pt will be independent with performing HEP to further progress towards primary goal of increasing independence with functional mobility    Baseline  dependent     Time  3    Period  Weeks    Status  New    Target Date  09/20/18      PT SHORT TERM GOAL #2   Title  Pt will participate in functional gait assessment to determine fall risk potential during functional mobility     Time  3    Period  Weeks    Status  New    Target Date  09/20/18      PT SHORT TERM GOAL #3   Title  Pt will participate in gait speed assessment to determine baseline functional level     Time  3    Period  Weeks    Status  New    Target Date  09/20/18      PT  SHORT TERM GOAL #4   Title  Pt will tolerate x1 viewing in standing to train VOR     Time  3    Period  Weeks    Status  New    Target Date  09/20/18      PT SHORT TERM GOAL #5   Title  Pt will increase overall cervical ROM by 5 degrees demonstrating improved ROM and reduced stiffness.     Baseline  report back to ROM findings under assessment tab     Time  3    Period  Weeks    Status  New    Target Date  09/20/18      Additional Short Term Goals   Additional Short Term Goals  Yes      PT SHORT TERM GOAL #6   Title  Pt will participate and tolerate a dynamic visual acuity assessment to further assess vestibular hypofunction    Time  3    Period  Weeks    Status  New    Target Date  09/20/18        PT Long Term Goals - 08/30/18 1600      PT LONG TERM GOAL #1   Title  Pt will demonstrate independence with final HEP    Time  6    Period  Weeks    Status  New    Target Date  10/14/18      PT LONG TERM GOAL #2   Title  Pt will demonstrate 10-12 deg overall improvement in cervical spine ROM with reports of abolishment of occipital headaches     Time  6    Period  Weeks    Status  New    Target Date  10/14/18      PT LONG TERM GOAL #3   Title  Pt will report 0/10 dizziness when performing head turns and changes in direction during gait demonstrating improvement in functional mobility     Time  6    Period  Weeks    Status  New    Target Date  10/14/18      PT LONG TERM GOAL #4   Title  Pt will improve gait speed time to >4.37 ft/sec indicating normal walking speed with no reports of dizziness or headaches     Time  6    Period  Weeks    Status  New    Target Date  10/14/18      PT LONG TERM GOAL #5   Title  Pt will improve FGA score to >/= to 25/30 to demonstrate low fall risk potential     Time  6    Period  Weeks    Status  New    Target Date  10/14/18      Additional Long Term Goals   Additional Long Term Goals  Yes      PT LONG TERM GOAL #6   Title   Pt will report ability to drive for short distances around neighborhood with no complaints of dizziness or onset of headaches demonstrating improvement in functional independence    Time  6    Period  Weeks    Status  New    Target Date  10/14/18      PT LONG TERM GOAL #7   Title  Pt will demonstrate a 2-3 line difference in the dynamic visual acuity  assessment indicating improvement in VOR    Time  6    Period  Weeks    Status  New    Target Date  10/14/18            Plan - 09/14/18 1054    Clinical Impression Statement  Pt's FGA score indicated pt is at a high risk for falls, and pt required several standing rest breaks during FGA 2/2 reporting concordant dizziness. Pt's gait speed was WNL with no dizziness reported. Pt tolerated short rockerboard well, as she did not require rest breaks and reported 1/10 dizziness during rockerboard activity. Pt was fearful of falling and used UE support intermittently but progressed to no UE support with PT providing min guard to min A as indicated. Pt experienced incr. postrual sway and dizziness with eyes closed and head turns on rockerboard. Dizziness incr. with backwards amb. and eyes closed and head turns during FGA. S/s likley multi-factorial with vestibular hypofunction. Continue with POC.     PT Next Visit Plan  Tall rockerboard and dynamic gait.     Consulted and Agree with Plan of Care  Patient       Patient will benefit from skilled therapeutic intervention in order to improve the following deficits and impairments:     Visit Diagnosis: Dizziness and giddiness  Unsteadiness on feet     Problem List Patient Active Problem List   Diagnosis Date Noted  . Vertigo 08/28/2018  . GERD 08/17/2007    Tajai Suder L 09/14/2018, 10:58 AM  [facility] [address] [place], [place], [address] Phone: [phone]   Fax:  [phone]  Name: Desiree Gardner MRN:  [id number] Date of Birth: 1966/01/12  Geoffry Paradise, PT,DPT 09/14/18 10:58 AM Phone: [phone] Fax: [phone]

## 2018-09-17 ENCOUNTER — Encounter: Payer: Self-pay | Admitting: Physical Therapy

## 2018-09-17 ENCOUNTER — Ambulatory Visit: Payer: Managed Care, Other (non HMO) | Admitting: Physical Therapy

## 2018-09-17 DIAGNOSIS — R42 Dizziness and giddiness: Secondary | ICD-10-CM

## 2018-09-17 DIAGNOSIS — R2681 Unsteadiness on feet: Secondary | ICD-10-CM

## 2018-09-17 DIAGNOSIS — M542 Cervicalgia: Secondary | ICD-10-CM

## 2018-09-17 NOTE — Patient Instructions (Signed)
Access Code: [email or web address]  URL: [email or web address]  Date: 09/17/2018  Prepared by: Misty Stanley   Exercises  Supine Cervical Retraction with Towel - 10 reps - 2 sets - 1x daily - 7x weekly  Supine Scapular Retraction in External Rotation - 10 reps - 2 sets - 1x daily - 7x weekly  Seated Assisted Cervical Rotation with Towel - 10 reps - 3 sets - 1x daily - 7x weekly  Mid-Lower Cervical Extension SNAG with Strap - 10 reps - 2 sets - 1x daily - 7x weekly  Seated Thoracic Extension with Hands Behind Neck - 10 reps - 3 sets - 1x daily - 7x weekly
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
## 2018-09-20 ENCOUNTER — Ambulatory Visit: Payer: Managed Care, Other (non HMO) | Admitting: Physical Therapy

## 2018-09-20 DIAGNOSIS — R42 Dizziness and giddiness: Secondary | ICD-10-CM

## 2018-09-20 DIAGNOSIS — R2681 Unsteadiness on feet: Secondary | ICD-10-CM
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
## 2018-09-20 NOTE — Patient Instructions (Signed)
Pt requested that progression of exercises - patterned background for x1 viewing; add standing feet together on compliant surface with EC - try head turns horizontal and vertical as able- Be emailed to her

## 2018-09-24 ENCOUNTER — Encounter (HOSPITAL_COMMUNITY): Payer: Self-pay | Admitting: *Deleted

## 2018-09-24 ENCOUNTER — Encounter: Payer: Self-pay | Admitting: Rehabilitative and Restorative Service Providers"

## 2018-09-24 ENCOUNTER — Ambulatory Visit: Payer: Managed Care, Other (non HMO) | Admitting: Rehabilitative and Restorative Service Providers"

## 2018-09-24 DIAGNOSIS — M542 Cervicalgia: Secondary | ICD-10-CM

## 2018-09-24 DIAGNOSIS — R42 Dizziness and giddiness: Secondary | ICD-10-CM | POA: Diagnosis not present

## 2018-09-24 DIAGNOSIS — R2681 Unsteadiness on feet: Secondary | ICD-10-CM

## 2018-09-24 NOTE — Patient Instructions (Signed)
[id number]  Thoracic Self-Mobilization (Supine)    With rolled towel placed lengthwise at lower ribs level, lie back on towel with arms outstretched. Hold _2 minutes. Relax. Repeat __1-2__ times per day as needed for postural stretching.  http://orth.exer.us/1001

## 2018-09-24 NOTE — Progress Notes (Signed)
Patient denies chest pain, shob, or cardiology visits. Was unable to complete educational assessment or sleep apnea screen d/t patient having multiple appointments that would not be done until past 7 pm tonight.

## 2018-09-24 NOTE — Therapy (Signed)
[facility] [address] [place], [place], [address] Phone: [phone]   Fax:  [phone]  Physical Therapy Treatment  Patient Details  Name: Mathew Storck MRN: [id number] Date of Birth: Feb 22, 1966 Referring Provider (PT): Sarina Ill MD   Encounter Date: 09/24/2018  PT End of Session - 09/24/18 2117    Visit Number  7    Number of Visits  12    Date for PT Re-Evaluation  10/14/18    Authorization Type  [employer] Managed     PT Start Time  [phone]    PT Stop Time  1020    PT Time Calculation (min)  44 min    Behavior During Therapy  Anxious       Past Medical History:  Diagnosis Date  . Headache    Dr. Domingo Cocking  . Plantar fasciitis   . PNA (pneumonia) 2011  . Syncope 07/2011   fall, under stress  . Vaso vagal episode   . Vertigo     Past Surgical History:  Procedure Laterality Date  . BREAST REDUCTION SURGERY  11/1986  . BUNIONECTOMY  11/2000    There were no vitals filed for this visit.  Subjective Assessment - 09/24/18 0938    Subjective  The patient feels that symptoms are improved related to dizziness, there is a "lack of adhering to the ground", further describes as slight lightheadedness rated 1/10.      Diagnostic tests  Scheduled for MRI under sedation in October to rule out vascular causes or tumor, CT was WNL    Patient Stated Goals  To get better and be able to drive again ([place] highway[place]    Currently in Pain?  --   no neck pain                      [facility] Adult PT Treatment/Exercise - 09/24/18 2123      Self-Care   Self-Care  Other Self-Care Comments    Other Self-Care Comments   discussed gaze x 1 viewing and patient feels that busy background does not increase symptoms.  She notes that neck extension reduces tightness in neck/suboccipital region.      Exercises   Exercises  Other Exercises;Neck    Other Exercises   Patient noted she has not had time to perform HEP due  to increased stress over the past week.  We reviewed HEP ( see below)      Neck Exercises: Seated   Cervical Rotation  Both    Cervical Rotation Limitations  With towel for overpressure *patient demonstrates "I can move my neck all the way to the side now" and has 80+ degrees AROM bilateral rotation.    Other Seated Exercise  Seated neck extension *patient uses hands instead of towel.        Neck Exercises: Supine   Neck Retraction  10 reps    Neck Retraction Limitations  Pt compensating using lower thoracic spine extension (ribs lift) and noting she feels it most in her stomach muscles.  PT provided towel roll instead of pillow, manual resistance, bilat LEs on physioball to relax spine/core.  Patient continues to extend through lower thoracic spine.  *Removed from HEP.    Other Supine Exercise  Reviewed HEP of supine scapular retraction and patient notes not feeling this exercise.  She discussed h/o rounded shoulders until she had a breast reduction.  PT provided towel roll stretch to passively work on upper thoracic extension  since active movements get increased lower thoracic extension.    Other Supine Exercise  Patient needs cues to try exercise as after each rep she states "I'm not feeling it in the right spot."      Neck Exercises: Prone   Neck Retraction  5 reps    Neck Retraction Limitations  Attempted cervical retraction in prone position, however patient uses scapular protraction to compensate.               PT Education - 09/24/18 2115    Education Details  Modified HEP from medbridge to remove supine chin tuck due to patient using lower thoracic extension to attempt to perform movement; also removed thoracic extension stretch as patient does with lower thoracic spine.  ADDED:  upper thoracic extension over towel roll (a more passive exercise to get extension in intended area at this time).    Person(s) Educated  Patient    Methods  Explanation;Demonstration;Handout     Comprehension  Returned demonstration;Verbalized understanding       PT Short Term Goals - 09/17/18 0939      PT SHORT TERM GOAL #1   Title  Pt will be independent with performing HEP to further progress towards primary goal of increasing independence with functional mobility    Baseline  dependent     Time  3    Period  Weeks    Status  Achieved      PT SHORT TERM GOAL #2   Title  Pt will participate in functional gait assessment to determine fall risk potential during functional mobility     Baseline  18/30    Time  3    Period  Weeks    Status  Achieved      PT SHORT TERM GOAL #3   Title  Pt will participate in gait speed assessment to determine baseline functional level     Baseline  4.28 ft/sec    Time  3    Period  Weeks    Status  Achieved      PT SHORT TERM GOAL #4   Title  Pt will tolerate x1 viewing in standing to train VOR     Time  3    Period  Weeks    Status  Achieved      PT SHORT TERM GOAL #5   Title  Pt will increase overall cervical ROM by 5 degrees demonstrating improved ROM and reduced stiffness.     Baseline  report back to ROM findings under assessment tab     Time  3    Period  Weeks    Status  Partially Met      PT SHORT TERM GOAL #6   Title  Pt will participate and tolerate a dynamic visual acuity assessment to further assess vestibular hypofunction    Time  3    Period  Weeks    Status  Achieved        PT Long Term Goals - 09/17/18 2002      PT LONG TERM GOAL #1   Title  Pt will demonstrate independence with final HEP    Time  6    Period  Weeks    Status  New    Target Date  10/14/18      PT LONG TERM GOAL #2   Title  Pt will demonstrate 10-12 deg overall improvement in cervical spine ROM with reports of abolishment of occipital headaches     Time  6    Period  Weeks    Status  New    Target Date  10/14/18      PT LONG TERM GOAL #3   Title  Pt will report 0/10 dizziness when performing head turns and changes in direction  during gait demonstrating improvement in functional mobility     Time  6    Period  Weeks    Status  New    Target Date  10/14/18      PT LONG TERM GOAL #4   Title  Pt will improve gait speed time to >4.37 ft/sec indicating normal walking speed with no reports of dizziness or headaches     Baseline  4.28 ft/sec    Time  6    Period  Weeks    Status  Revised    Target Date  10/14/18      PT LONG TERM GOAL #5   Title  Pt will improve FGA score to >/= to 23/30 to demonstrate low fall risk potential     Baseline  18/30    Time  6    Period  Weeks    Status  Revised    Target Date  10/14/18      PT LONG TERM GOAL #6   Title  Pt will report ability to drive for short distances around neighborhood with no complaints of dizziness or onset of headaches demonstrating improvement in functional independence    Time  6    Period  Weeks    Status  New    Target Date  10/14/18      PT LONG TERM GOAL #7   Title  Pt will demonstrate a 2-3 line difference in the dynamic visual acuity assessment indicating improvement in VOR    Baseline  3-4 line difference (line 7 and line 3)    Time  6    Period  Weeks    Status  Revised    Target Date  10/14/18            Plan - 09/24/18 2139    Clinical Impression Statement  The patient reports improved dizziness overall.  Stress continues to limit her participation in HEP.  Today's session emphasized review of neck HEP and consolidation of HEP to include cervical exercises that patient is comfortable performing.   PT to review balance/gaze exercises at next session.    PT Treatment/Interventions  ADLs/Self Care Home Management;Gait training;Neuromuscular re-education;Passive range of motion;Stair training;Functional mobility training;Therapeutic activities;Patient/family education;Therapeutic exercise;Balance training;Vestibular;Canalith Repostioning;Taping;Dry needling;Manual techniques    PT Next Visit Plan  Check VOR exercises, ensure neck HEP  going well, dynamic gait, rockerboard.    Consulted and Agree with Plan of Care  Patient       Patient will benefit from skilled therapeutic intervention in order to improve the following deficits and impairments:  Dizziness, Decreased range of motion, Decreased balance, Difficulty walking, Pain, Postural dysfunction  Visit Diagnosis: Unsteadiness on feet  Dizziness and giddiness  Cervicalgia     Problem List Patient Active Problem List   Diagnosis Date Noted  . Vertigo 08/28/2018  . GERD 08/17/2007    Anelly Samarin, PT 09/24/2018, 9:48 PM  [facility] [address] [place], [place], [address] Phone: [phone]   Fax:  [phone]  Name: Mahlet Jergens MRN: [id number] Date of Birth: [birth year]

## 2018-09-25 ENCOUNTER — Ambulatory Visit (HOSPITAL_COMMUNITY)
Admission: RE | Admit: 2018-09-25 | Discharge: 2018-09-25 | Disposition: A | Discharge status: Home / Self Care | Payer: Managed Care, Other (non HMO) | Source: Ambulatory Visit | Attending: Internal Medicine | Admitting: Internal Medicine

## 2018-09-25 ENCOUNTER — Encounter (HOSPITAL_COMMUNITY): Payer: Self-pay | Admitting: *Deleted

## 2018-09-25 ENCOUNTER — Encounter (HOSPITAL_COMMUNITY)
Admission: RE | Disposition: A | Discharge status: Home / Self Care | Payer: Self-pay | Source: Ambulatory Visit | Attending: Internal Medicine

## 2018-09-25 ENCOUNTER — Ambulatory Visit (HOSPITAL_COMMUNITY): Payer: Managed Care, Other (non HMO)

## 2018-09-25 ENCOUNTER — Encounter (HOSPITAL_COMMUNITY): Payer: Self-pay | Admitting: Registered Nurse

## 2018-09-25 DIAGNOSIS — R278 Other lack of coordination: Secondary | ICD-10-CM | POA: Diagnosis not present

## 2018-09-25 DIAGNOSIS — M542 Cervicalgia: Secondary | ICD-10-CM | POA: Insufficient documentation

## 2018-09-25 DIAGNOSIS — R42 Dizziness and giddiness: Secondary | ICD-10-CM

## 2018-09-25 DIAGNOSIS — R270 Ataxia, unspecified: Secondary | ICD-10-CM

## 2018-09-25 DIAGNOSIS — H814 Vertigo of central origin: Secondary | ICD-10-CM

## 2018-09-25 DIAGNOSIS — R519 Headache, unspecified: Secondary | ICD-10-CM

## 2018-09-25 DIAGNOSIS — I670 Dissection of cerebral arteries, nonruptured: Secondary | ICD-10-CM

## 2018-09-25 DIAGNOSIS — R51 Headache: Secondary | ICD-10-CM

## 2018-09-25 HISTORY — PX: RADIOLOGY WITH ANESTHESIA: SHX6223

## 2018-09-25 HISTORY — DX: Syncope and collapse: R55

## 2018-09-25 LAB — BASIC METABOLIC PANEL
ANION GAP: 7 (ref 5–15)
BUN: 12 mg/dL (ref 6–20)
CALCIUM: 9.3 mg/dL (ref 8.9–10.3)
CO2: 24 mmol/L (ref 22–32)
Chloride: 107 mmol/L (ref 98–111)
Creatinine, Ser: 0.78 mg/dL (ref 0.44–1.00)
GFR calc Af Amer: >60 mL/min (ref >60)
GFR calc non Af Amer: >60 mL/min (ref >60)
Glucose, Bld: 116 mg/dL — ABNORMAL HIGH (ref 70–99)
Potassium: 3.8 mmol/L (ref 3.5–5.1)
Sodium: 138 mmol/L (ref 135–145)

## 2018-09-25 LAB — POCT PREGNANCY, URINE: PREG TEST UR: NEGATIVE

## 2018-09-25 LAB — HEMOGLOBIN: Hemoglobin: 13.3 g/dL (ref 12.0–15.0)

## 2018-09-25 SURGERY — MRI WITH ANESTHESIA
Anesthesia: General

## 2018-09-25 MED ORDER — GADOBUTROL 1 MMOL/ML IV SOLN
7.0000 mL | Freq: Once | INTRAVENOUS | Status: AC | PRN
  Administered 2018-09-25: 7 mL via INTRAVENOUS

## 2018-09-25 MED ORDER — LACTATED RINGERS IV SOLN: INTRAVENOUS | Status: DC

## 2018-09-25 NOTE — Progress Notes (Signed)
Per Dr. Valma Cava ok for patient to take benadryl tonight 10/15.  Rowe Pavy, RN

## 2018-09-25 NOTE — Anesthesia Preprocedure Evaluation (Signed)
Anesthesia Evaluation  Patient identified by MRN, date of birth, ID band  Reviewed: Allergy & Precautions, NPO status , Patient's Chart, lab work & pertinent test results  Airway Mallampati: II  TM Distance: >3 FB Neck ROM: Full    Dental no notable dental hx.    Pulmonary neg pulmonary ROS, former smoker,    Pulmonary exam normal breath sounds clear to auscultation       Cardiovascular negative cardio ROS Normal cardiovascular exam Rhythm:Regular Rate:Normal     Neuro/Psych  Headaches, negative psych ROS   GI/Hepatic Neg liver ROS, GERD  ,  Endo/Other  negative endocrine ROS  Renal/GU negative Renal ROS  negative genitourinary   Musculoskeletal   Abdominal   Peds  Hematology negative hematology ROS (+)   Anesthesia Other Findings   Reproductive/Obstetrics                             Anesthesia Physical Anesthesia Plan  ASA: I  Anesthesia Plan: General   Post-op Pain Management:    Induction: Intravenous  PONV Risk Score and Plan: Treatment may vary due to age or medical condition  Airway Management Planned: LMA  Additional Equipment:   Intra-op Plan:   Post-operative Plan: Extubation in OR  Informed Consent: I have reviewed the patients History and Physical, chart, labs and discussed the procedure including the risks, benefits and alternatives for the proposed anesthesia with the patient or authorized representative who has indicated his/her understanding and acceptance.   Dental advisory given  Plan Discussed with: CRNA  Anesthesia Plan Comments:         Anesthesia Quick Evaluation

## 2018-09-25 NOTE — H&P (Signed)
Anesthesia H&P Update: History and Physical Exam reviewed; patient is OK for planned anesthetic and procedure. ? ?

## 2018-09-26 ENCOUNTER — Encounter (HOSPITAL_COMMUNITY): Payer: Self-pay | Admitting: Radiology

## 2018-09-26 ENCOUNTER — Telehealth: Payer: Self-pay | Admitting: *Deleted

## 2018-09-26 MED FILL — Lidocaine HCl(Cardiac) IV PF Soln Pref Syr 100 MG/5ML (2%): INTRAVENOUS | Qty: 5 | Status: AC

## 2018-09-26 MED FILL — Ondansetron HCl Inj 4 MG/2ML (2 MG/ML): INTRAMUSCULAR | Qty: 2 | Status: AC

## 2018-09-26 MED FILL — Propofol IV Emul 200 MG/20ML (10 MG/ML): INTRAVENOUS | Qty: 20 | Status: AC

## 2018-09-26 MED FILL — Fentanyl Citrate Preservative Free (PF) Inj 100 MCG/2ML: INTRAMUSCULAR | Qty: 2 | Status: AC

## 2018-09-26 MED FILL — Midazolam HCl Inj 2 MG/2ML (Base Equivalent): INTRAMUSCULAR | Qty: 2 | Status: AC

## 2018-09-26 MED FILL — Dexamethasone Sod Phosphate Preservative Free Inj 10 MG/ML: INTRAMUSCULAR | Qty: 1 | Status: AC

## 2018-09-26 MED FILL — Phenylephrine-NaCl Pref Syr 0.4 MG/10ML-0.9% (40 MCG/ML): INTRAVENOUS | Qty: 10 | Status: AC

## 2018-09-26 MED FILL — Lactated Ringer's Solution: INTRAVENOUS | Qty: 1000 | Status: AC

## 2018-09-26 MED FILL — Ephedrine Sulf-NaCl Soln Pref Syr 50 MG/10ML-0.9% (5 MG/ML): INTRAVENOUS | Qty: 10 | Status: AC

## 2018-09-26 NOTE — Transfer of Care (Signed)
Immediate Anesthesia Transfer of Care Note  Patient: Desiree Gardner  Procedure(s) Performed: MRI OF THE BRAIN WITH AND WITHOUT CONTRAST AND MRA OF THE BRAIN WITHOUT CONTRAST , MRA OF NECK WITH AND WITHOUT CONSTRAST (N/A )  Patient Location: PACU  Anesthesia Type:General  Level of Consciousness: awake, patient cooperative and responds to stimulation  Airway & Oxygen Therapy: Patient Spontanous Breathing  Post-op Assessment: Report given to RN and Post -op Vital signs reviewed and stable  Post vital signs: Reviewed and stable  Last Vitals:  Vitals Value Taken Time  BP    Temp    Pulse    Resp    SpO2      Last Pain: There were no vitals filed for this visit.       Complications: No apparent anesthesia complications

## 2018-09-26 NOTE — Telephone Encounter (Addendum)
Called pt and LVM (ok per DPR) informing patient that Dr. Jaynee Eagles says her MRA of the head and neck look good, no blockages/ atherosclerosis or dissections, looks healthy. Left office number in case pt has any questions. Informed pt that call back is not required.   ----- Message from Melvenia Beam, MD sent at 09/25/2018  1:29 PM EDT ----- MRA of the head and neck look good no blockages/atherosclerosis or dissections, looks healthy thanks

## 2018-09-28 ENCOUNTER — Encounter: Payer: Self-pay | Admitting: Rehabilitative and Restorative Service Providers"

## 2018-09-28 ENCOUNTER — Ambulatory Visit: Payer: Managed Care, Other (non HMO) | Admitting: Rehabilitative and Restorative Service Providers"

## 2018-09-28 DIAGNOSIS — R42 Dizziness and giddiness: Secondary | ICD-10-CM | POA: Diagnosis not present

## 2018-09-28 DIAGNOSIS — M542 Cervicalgia: Secondary | ICD-10-CM

## 2018-09-28 DIAGNOSIS — R2681 Unsteadiness on feet: Secondary | ICD-10-CM

## 2018-09-28 NOTE — Therapy (Signed)
[facility] [address] [place], [place], [address] Phone: [phone]   Fax:  [phone]  Physical Therapy Treatment  Patient Details  Name: Francine Hannan MRN: [id number] Date of Birth: 1966/04/21 Referring Provider (PT): Sarina Ill MD   Encounter Date: 09/28/2018  PT End of Session - 09/28/18 1446    Visit Number  8    Number of Visits  12    Date for PT Re-Evaluation  10/14/18    Authorization Type  [employer] Managed     PT Start Time  1448    PT Stop Time  1550    PT Time Calculation (min)  62 min    Activity Tolerance  Patient tolerated treatment well    Behavior During Therapy  Agitated;Anxious   improved with session      Past Medical History:  Diagnosis Date  . Headache    Dr. Domingo Cocking  . Plantar fasciitis   . PNA (pneumonia) 2011  . Syncope 07/2011   fall, under stress  . Vaso vagal episode   . Vertigo     Past Surgical History:  Procedure Laterality Date  . BREAST REDUCTION SURGERY  11/1986  . BUNIONECTOMY  11/2000  . RADIOLOGY WITH ANESTHESIA N/A 09/25/2018   Procedure: MRI OF THE BRAIN WITH AND WITHOUT CONTRAST AND MRA OF THE BRAIN WITHOUT CONTRAST , MRA OF NECK WITH AND WITHOUT CONSTRAST;  Surgeon: Radiologist, Medication, MD;  Location: [facility];  Service: Radiology;  Laterality: N/A;    There were no vitals filed for this visit.  Subjective Assessment - 09/28/18 1450    Subjective  "I'm super depressed.  The testing came back normal and I want answers."  She feels the exercises are making her more dizzy.  She feels her neck ROM is back to full.  She notes she has had a HA x 2-3 days and points to her suboccipital region as where her HA is occurring.  She feels continued brain fog since procedure.     Pertinent History  intermittent claudication, plantar fasciitis, syncope, vertigo, PNA    Patient Stated Goals  To get better and be able to drive again [place] highway[place]    Currently in Pain?   Yes   "It's bad.  Today I'm in pain."   Pain Score  5     Pain Location  Head    Pain Descriptors / Indicators  Headache    Pain Type  Chronic pain    Pain Frequency  Constant    Aggravating Factors   stress    Pain Relieving Factors  improved posture         [facility] PT Assessment - 09/28/18 1600      AROM   Cervical - Right Rotation  WFLs=90 deg    Cervical - Left Rotation  WFLs=90 deg         Vestibular Assessment - 09/28/18 1453      Vestibular Assessment   General Observation  Once dizziness begins it remains for hours.   It's hard to say if dizziness always accompanies HA or not.                 [facility] Adult PT Treatment/Exercise - 09/28/18 1603      Self-Care   Self-Care  Other Self-Care Comments    Other Self-Care Comments   Instructed patient in use of ice massage in suboccipital region to reduce muscle spasm.  Discussed progressing to return to weight lifting avoiding  overhead resistance training and beginning at 50% of prior weight to start slow and determine tolerance before progressing.  Patient rides bike x 35 minutes/day.        Exercises   Exercises  Other Exercises    Other Exercises   Patient instructed in home use of tennis balls for suboccipital release.        Manual Therapy   Manual Therapy  Joint mobilization;Soft tissue mobilization;Myofascial release;Manual Traction    Manual therapy comments  To reduce headache and suboccipital tightness.    Also performed 5 minutes of ice massage in prone for suboccipital trigger point release.    Joint Mobilization  lateral glides mid cervical spine grade II for relaxation    Soft tissue mobilization  scalene soft tissue mobilization, cervical stabilizers and suboccipital muscle release.    Myofascial Release  supine suboccipital release     Manual Traction  gentle manual traction for muscle relaxation and HA mgmt               PT Short Term Goals - 09/17/18 0939      PT SHORT TERM GOAL #1    Title  Pt will be independent with performing HEP to further progress towards primary goal of increasing independence with functional mobility    Baseline  dependent     Time  3    Period  Weeks    Status  Achieved      PT SHORT TERM GOAL #2   Title  Pt will participate in functional gait assessment to determine fall risk potential during functional mobility     Baseline  18/30    Time  3    Period  Weeks    Status  Achieved      PT SHORT TERM GOAL #3   Title  Pt will participate in gait speed assessment to determine baseline functional level     Baseline  4.28 ft/sec    Time  3    Period  Weeks    Status  Achieved      PT SHORT TERM GOAL #4   Title  Pt will tolerate x1 viewing in standing to train VOR     Time  3    Period  Weeks    Status  Achieved      PT SHORT TERM GOAL #5   Title  Pt will increase overall cervical ROM by 5 degrees demonstrating improved ROM and reduced stiffness.     Baseline  report back to ROM findings under assessment tab     Time  3    Period  Weeks    Status  Partially Met      PT SHORT TERM GOAL #6   Title  Pt will participate and tolerate a dynamic visual acuity assessment to further assess vestibular hypofunction    Time  3    Period  Weeks    Status  Achieved        PT Long Term Goals - 09/28/18 1559      PT LONG TERM GOAL #1   Title  Pt will demonstrate independence with final HEP    Time  6    Period  Weeks    Status  On-going      PT LONG TERM GOAL #2   Title  Pt will demonstrate 10-12 deg overall improvement in cervical spine ROM with reports of abolishment of occipital headaches     Baseline  Patient subjectively reports "my neck has  full motion" and demonstrates 90 degrees rotation right and left.    Time  6    Period  Weeks    Status  Achieved      PT LONG TERM GOAL #3   Title  Pt will report 0/10 dizziness when performing head turns and changes in direction during gait demonstrating improvement in functional mobility      Time  6    Period  Weeks    Status  On-going      PT LONG TERM GOAL #4   Title  Pt will improve gait speed time to >4.37 ft/sec indicating normal walking speed with no reports of dizziness or headaches     Baseline  4.28 ft/sec  *Met per notes on 09/14/2018    Time  6    Period  Weeks    Status  Achieved      PT LONG TERM GOAL #5   Title  Pt will improve FGA score to >/= to 23/30 to demonstrate low fall risk potential     Baseline  18/30    Time  6    Period  Weeks    Status  On-going      PT LONG TERM GOAL #6   Title  Pt will report ability to drive for short distances around neighborhood with no complaints of dizziness or onset of headaches demonstrating improvement in functional independence    Time  6    Period  Weeks    Status  On-going      PT LONG TERM GOAL #7   Title  Pt will demonstrate a 2-3 line difference in the dynamic visual acuity assessment indicating improvement in VOR    Baseline  3-4 line difference (line 7 and line 3)    Time  6    Period  Weeks    Status  On-going            Plan - 09/28/18 1611    Clinical Impression Statement  At end of session, patient reports "I feel so much better than when I came in, can I hug you?"  Her headache went down from 5/10 to 1/10.  She notes that when her head hurts it makes her irritable.  PT and patient discussed stress management related to her parents, her son, and discussed returning to gym routine for stress mgmt.  Also emphasized adequate sleeping, drinking water, work station set up, and frequent rests during computer work.        PT Treatment/Interventions  ADLs/Self Care Home Management;Gait training;Neuromuscular re-education;Passive range of motion;Stair training;Functional mobility training;Therapeutic activities;Patient/family education;Therapeutic exercise;Balance training;Vestibular;Canalith Repostioning;Taping;Dry needling;Manual techniques    PT Next Visit Plan  Emphasize neck stabilization,  suboccipital release, cervical stretching.  Review any questions re: home suboccipital release and ice massage.    Consulted and Agree with Plan of Care  Patient       Patient will benefit from skilled therapeutic intervention in order to improve the following deficits and impairments:  Dizziness, Decreased range of motion, Decreased balance, Difficulty walking, Pain, Postural dysfunction  Visit Diagnosis: Unsteadiness on feet  Dizziness and giddiness  Cervicalgia     Problem List Patient Active Problem List   Diagnosis Date Noted  . Vertigo 08/28/2018  . GERD 08/17/2007    Noya Santarelli,PT 09/28/2018, 4:33 PM  [facility] [address] [place], [place], [address] Phone: [phone]   Fax:  [phone]  Name: Kiasha Bellin MRN: [id number] Date of Birth: Dec 03, 1966

## 2018-10-01 ENCOUNTER — Encounter: Payer: Self-pay | Admitting: Physical Therapy

## 2018-10-01 ENCOUNTER — Ambulatory Visit: Payer: Managed Care, Other (non HMO) | Admitting: Physical Therapy

## 2018-10-01 DIAGNOSIS — R42 Dizziness and giddiness: Secondary | ICD-10-CM | POA: Diagnosis not present

## 2018-10-01 DIAGNOSIS — M542 Cervicalgia: Secondary | ICD-10-CM

## 2018-10-01 DIAGNOSIS — R2681 Unsteadiness on feet: Secondary | ICD-10-CM

## 2018-10-01 NOTE — Patient Instructions (Addendum)
Stand on pillows in corner - make circles clockwise, counterclockwise, "V" and "+" patterns - 10 reps each direction  Plain ball - wrap in busy wrapping paper for more visually stimulating object  Thoracic Self-Mobilization (Supine)    With rolled towel placed lengthwise at lower ribs level, lie back on towel with arms outstretched. Hold _2 minutes. Relax. Repeat __1-2__ times per day as needed for postural stretching.   Also keep performing pressing back of head/neck into tennis ball to help with headache   FOR CORNER BALANCE EXERCISES ON PILLOW: Only perform 10 repetitions of head turns and head nods, feet together, eyes open and then eyes closed [id number]  Thoracic Self-Mobilization (Supine)    With rolled towel placed lengthwise at lower ribs level, lie back on towel with arms outstretched. Hold _2 minutes. Relax. Repeat __1-2__ times per day as needed for postural stretching.  [email or web address]    Access Code: [email or web address]  URL: [email or web address]  Date: 10/01/2018  Prepared by: Misty Stanley   Exercises  Standing Upper Cervical Flexion and Extension - 10 reps - 3 sets - 1x daily - 7x weekly  Mid-Lower Cervical Extension SNAG with Strap - 10 reps - 2 sets - 1x daily - 7x weekly

## 2018-10-01 NOTE — Therapy (Signed)
[facility] [address] [place], [place], [address] Phone: [phone]   Fax:  [phone]  Physical Therapy Treatment  Patient Details  Name: Desiree Gardner MRN: [id number] Date of Birth: 1966/06/15 Referring Provider (PT): Sarina Ill MD   Encounter Date: 10/01/2018  PT End of Session - 10/01/18 1620    Visit Number  9    Number of Visits  12    Date for PT Re-Evaluation  10/14/18    Authorization Type  [employer] Managed     PT Start Time  1445    PT Stop Time  1530    PT Time Calculation (min)  45 min    Activity Tolerance  Patient tolerated treatment well    Behavior During Therapy  [facility] for tasks assessed/performed       Past Medical History:  Diagnosis Date  . Headache    Dr. Domingo Cocking  . Plantar fasciitis   . PNA (pneumonia) 2011  . Syncope 07/2011   fall, under stress  . Vaso vagal episode   . Vertigo     Past Surgical History:  Procedure Laterality Date  . BREAST REDUCTION SURGERY  11/1986  . BUNIONECTOMY  11/2000  . RADIOLOGY WITH ANESTHESIA N/A 09/25/2018   Procedure: MRI OF THE BRAIN WITH AND WITHOUT CONTRAST AND MRA OF THE BRAIN WITHOUT CONTRAST , MRA OF NECK WITH AND WITHOUT CONSTRAST;  Surgeon: Radiologist, Medication, MD;  Location: [facility];  Service: Radiology;  Laterality: N/A;    There were no vitals filed for this visit.  Subjective Assessment - 10/01/18 1452    Subjective  Headache has improved from Friday; still slightly present but not as bad as Friday.  Has progressed to feet together on pillow but she feels more off balance and dizzy afterwards.     Pertinent History  intermittent claudication, plantar fasciitis, syncope, vertigo, PNA    Patient Stated Goals  To get better and be able [place] drive [place]    Currently in Pain?  Yes    Pain Score  1     Pain Location  Neck    Pain Orientation  Posterior    Pain Descriptors / Indicators  Discomfort    Pain Type  Chronic  pain                       [facility] Adult PT Treatment/Exercise - 10/01/18 1610      Therapeutic Activites    Therapeutic Activities  Work [employer];Other Therapeutic Activities    Work Simulation  discussed how patient's work station is set up at office; verbalized variety of ways for pt to modify work stations to decrease neck and shoulder tension and strain including raising monitor, lowering keyboard and placing UE at 90 deg flexion, taking intermittent breaks to close eyes, perform shoulder rolls, etc.      Other Therapeutic Activities  Discussed patient's expectations and questions regarding TDN; discussed common sensations felt during dry needling (aching, cramping, twitching, soreness).  Also educated on purpose and methodology of dry needling of muscular trigger points.  Pt does not have any concerns      Exercises   Exercises  Neck      Neck Exercises: Standing   Other Standing Exercises  guided upper cervical flexion <> extension with head pressed into ball against wall; cues to roll ball in small degrees up/down wall to isolate upper cervical motion and decrease compensatory use of trunk flexion/extension  Vestibular Treatment/Exercise - 10/01/18 1503      Vestibular Treatment/Exercise   Vestibular Treatment Provided  Gaze    Gaze Exercises  X1 Viewing Horizontal;X1 Viewing Vertical      X1 Viewing Horizontal   Foot Position  feet together    Reps  2    Comments  60 seconds; busy background; verbal cues to slow head turns slightly to improve balance and to avoid using shoulder rotation as substitute for head rotation      X1 Viewing Vertical   Foot Position  feet together    Reps  2    Comments  60 seconds but continues to have increased difficulty coordinating upper cervical flexion<>extension and continues to substitute trunk flexion/extension         Balance Exercises - 10/01/18 1611      Balance Exercises: Standing   Standing Eyes Opened   Narrow base of support (BOS);Head turns;Foam/compliant surface;Other reps (comment)   10 reps   Standing Eyes Closed  Narrow base of support (BOS);Head turns;Foam/compliant surface;Other reps (comment)   10 reps   Other Standing Exercises  Pt has been performing corner balance with head turns x 1 minute each set and reporting significant imbalance and dizziness afterwards for >30 minutes.  Advised pt to return to 10 repetitions for head turns and that 60 seconds x 4 sets may have been overstimulating her vestibular system.  Also advised pt to slow head turns/nods to allow utilization of balance reactions to maintain postural control        PT Education - 10/01/18 1619    Education Details  Added standing upper cervical flexion/extension to isolate movement and re-educate movement before performing letter exercise and corner exercise; returned to 10 reps for corner balance head turns due to overstimulation with 60 seconds of head turns, educated on TDN and work station set up     [employer]) Educated  Patient    Methods  Explanation;Demonstration;Handout    Comprehension  Verbalized understanding;Returned demonstration       PT Short Term Goals - 09/17/18 0939      PT SHORT TERM GOAL #1   Title  Pt will be independent with performing HEP to further progress towards primary goal of increasing independence with functional mobility    Baseline  dependent     Time  3    Period  Weeks    Status  Achieved      PT SHORT TERM GOAL #2   Title  Pt will participate in functional gait assessment to determine fall risk potential during functional mobility     Baseline  18/30    Time  3    Period  Weeks    Status  Achieved      PT SHORT TERM GOAL #3   Title  Pt will participate in gait speed assessment to determine baseline functional level     Baseline  4.28 ft/sec    Time  3    Period  Weeks    Status  Achieved      PT SHORT TERM GOAL #4   Title  Pt will tolerate x1 viewing in standing to  train VOR     Time  3    Period  Weeks    Status  Achieved      PT SHORT TERM GOAL #5   Title  Pt will increase overall cervical ROM by 5 degrees demonstrating improved ROM and reduced stiffness.     Baseline  report back  to ROM findings under assessment tab     Time  3    Period  Weeks    Status  Partially Met      PT SHORT TERM GOAL #6   Title  Pt will participate and tolerate a dynamic visual acuity assessment to further assess vestibular hypofunction    Time  3    Period  Weeks    Status  Achieved        PT Long Term Goals - 09/28/18 1559      PT LONG TERM GOAL #1   Title  Pt will demonstrate independence with final HEP    Time  6    Period  Weeks    Status  On-going      PT LONG TERM GOAL #2   Title  Pt will demonstrate 10-12 deg overall improvement in cervical spine ROM with reports of abolishment of occipital headaches     Baseline  Patient subjectively reports "my neck has full motion" and demonstrates 90 degrees rotation right and left.    Time  6    Period  Weeks    Status  Achieved      PT LONG TERM GOAL #3   Title  Pt will report 0/10 dizziness when performing head turns and changes in direction during gait demonstrating improvement in functional mobility     Time  6    Period  Weeks    Status  On-going      PT LONG TERM GOAL #4   Title  Pt will improve gait speed time to >4.37 ft/sec indicating normal walking speed with no reports of dizziness or headaches     Baseline  4.28 ft/sec  *Met per notes on 09/14/2018    Time  6    Period  Weeks    Status  Achieved      PT LONG TERM GOAL #5   Title  Pt will improve FGA score to >/= to 23/30 to demonstrate low fall risk potential     Baseline  18/30    Time  6    Period  Weeks    Status  On-going      PT LONG TERM GOAL #6   Title  Pt will report ability to drive for short distances around neighborhood with no complaints of dizziness or onset of headaches demonstrating improvement in functional independence     Time  6    Period  Weeks    Status  On-going      PT LONG TERM GOAL #7   Title  Pt will demonstrate a 2-3 line difference in the dynamic visual acuity assessment indicating improvement in VOR    Baseline  3-4 line difference (line 7 and line 3)    Time  6    Period  Weeks    Status  On-going            Plan - 10/01/18 1621    Clinical Impression Statement  Pt less agitated and anxious today.  Continues to feel that everyone is just treating the "symptoms" but no one can find the root cause in order to deal with that.  Pt required ongoing education regarding purpose of vestibular therapy for addressing impairments related to hypofunction and cervical spine limitations and providing pt with the self-awareness and monitoring symptoms, symptom triggers, and stress level as well as ways to physically manage stress and symptoms.  Treatment session focused on return to vestibular and balance exercises and  adjustment of exercises based on severity of symptoms.  Pt still has increased difficulty with neck flexion and extension; will continue to address in order to progress towards LTG.    PT Treatment/Interventions  ADLs/Self Care Home Management;Gait training;Neuromuscular re-education;Passive range of motion;Stair training;Functional mobility training;Therapeutic activities;Patient/family education;Therapeutic exercise;Balance training;Vestibular;Canalith Repostioning;Taping;Dry needling;Manual techniques    PT Next Visit Plan  TDN suboccipital mm and anything else in shoulder/cervical spine that is limiting ROM, causing HA.  Review stretches and isolation of cervical flexion/extension with head pressed against ball on wall.  MONDAY: begin to reassess LTG and make plan for hold vs. more visits?    PT Home Exercise Plan  x1 viewing and balance on foam; added tracking ball in various directions while standing on pillow.  MEDBRIDGE neck exercises: [id number]; emailed ex. progression of patterned  background and feet together with EC with head turns on 09-20-18    Consulted and Agree with Plan of Care  Patient       Patient will benefit from skilled therapeutic intervention in order to improve the following deficits and impairments:  Dizziness, Decreased range of motion, Decreased balance, Difficulty walking, Pain, Postural dysfunction  Visit Diagnosis: Unsteadiness on feet  Dizziness and giddiness  Cervicalgia     Problem List Patient Active Problem List   Diagnosis Date Noted  . Vertigo 08/28/2018  . GERD 08/17/2007   Rico Junker, PT, DPT 10/01/18    4:31 PM    [facility] [address] [place], [place], [address] Phone: [phone]   Fax:  [phone]  Name: Desiree Gardner MRN: [id number] Date of Birth: 06/12/1966

## 2018-10-05 ENCOUNTER — Ambulatory Visit: Payer: Managed Care, Other (non HMO) | Admitting: Physical Therapy

## 2018-10-05 ENCOUNTER — Encounter: Payer: Self-pay | Admitting: Physical Therapy

## 2018-10-05 DIAGNOSIS — R2681 Unsteadiness on feet: Secondary | ICD-10-CM

## 2018-10-05 DIAGNOSIS — R42 Dizziness and giddiness: Secondary | ICD-10-CM | POA: Diagnosis not present

## 2018-10-05 DIAGNOSIS — M542 Cervicalgia: Secondary | ICD-10-CM

## 2018-10-05 NOTE — Therapy (Signed)
[facility] [address] [place], [place], [address] Phone: [phone]   Fax:  [phone]  Physical Therapy Treatment  Patient Details  Name: Desiree Gardner MRN: [id number] Date of Birth: [birth year] Referring Provider (PT): Sarina Ill MD   Encounter Date: 10/05/2018  PT End of Session - 10/05/18 1027    Visit Number  10    Number of Visits  12    Date for PT Re-Evaluation  10/14/18    Authorization Type  [employer] Managed     PT Start Time  [phone]    PT Stop Time  0940   10 min moist heat at end of sesison   PT Time Calculation (min)  50 min    Activity Tolerance  Patient tolerated treatment well;No increased pain    Behavior During Therapy  WFL for tasks assessed/performed       Past Medical History:  Diagnosis Date  . Headache    Dr. Domingo Cocking  . Plantar fasciitis   . PNA (pneumonia) 2011  . Syncope 07/2011   fall, under stress  . Vaso vagal episode   . Vertigo     Past Surgical History:  Procedure Laterality Date  . BREAST REDUCTION SURGERY  11/1986  . BUNIONECTOMY  11/2000  . RADIOLOGY WITH ANESTHESIA N/A 09/25/2018   Procedure: MRI OF THE BRAIN WITH AND WITHOUT CONTRAST AND MRA OF THE BRAIN WITHOUT CONTRAST , MRA OF NECK WITH AND WITHOUT CONSTRAST;  Surgeon: Radiologist, Medication, MD;  Location: [facility];  Service: Radiology;  Laterality: N/A;    There were no vitals filed for this visit.  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    Currently in Pain?  Yes    Pain Score  1     Pain Location  Neck    Pain Orientation  Posterior    Pain Descriptors / Indicators  Aching    Pain Type  Chronic pain                        [facility] Adult PT Treatment/Exercise - 10/05/18 0001      Neck Exercises: Standing   Other Standing Exercises  standing B pec stretch in doorway - mid level 2 x 30 sec      Moist Heat Therapy   Number Minutes Moist Heat  10 Minutes    Moist Heat Location  Cervical;Shoulder      Manual Therapy   Manual Therapy  Joint mobilization;Soft tissue mobilization;Myofascial release    Manual therapy comments  patient in supine - require multiple cues to "relax"    Joint Mobilization  grade II CPAs to cervical and thoracic spine     Soft tissue mobilization  STM to B UT, B LS, B infra, B cervical paraspinals; manual UT stretch (B), manual shoulder depression stretch, manual cervical flexion/rotation stretch    Myofascial Release  suboccipital release 4 x 30 sec       Trigger Point Dry Needling - 10/05/18 1032    Consent Given?  Yes    Education Handout Provided  Yes    Muscles Treated Upper Body  Suboccipitals muscle group   bilateral  SubOccipitals Response  Twitch response elicited;Palpable increased muscle length           PT Education - 10/05/18 1024    Education Details  education on justification of TDN, expectations during and after treatment with good verbal understanding    Person(s) Educated  Patient    Methods  Explanation    Comprehension  Verbalized understanding       PT Short Term Goals - 09/17/18 0939      PT SHORT TERM GOAL #1   Title  Pt will be independent with performing HEP to further progress towards primary goal of increasing independence with functional mobility    Baseline  dependent     Time  3    Period  Weeks    Status  Achieved      PT SHORT TERM GOAL #2   Title  Pt will participate in functional gait assessment to determine fall risk potential during functional mobility     Baseline  18/30    Time  3    Period  Weeks    Status  Achieved      PT SHORT TERM GOAL #3   Title  Pt will participate in gait speed  assessment to determine baseline functional level     Baseline  4.28 ft/sec    Time  3    Period  Weeks    Status  Achieved      PT SHORT TERM GOAL #4   Title  Pt will tolerate x1 viewing in standing to train VOR     Time  3    Period  Weeks    Status  Achieved      PT SHORT TERM GOAL #5   Title  Pt will increase overall cervical ROM by 5 degrees demonstrating improved ROM and reduced stiffness.     Baseline  report back to ROM findings under assessment tab     Time  3    Period  Weeks    Status  Partially Met      PT SHORT TERM GOAL #6   Title  Pt will participate and tolerate a dynamic visual acuity assessment to further assess vestibular hypofunction    Time  3    Period  Weeks    Status  Achieved        PT Long Term Goals - 09/28/18 1559      PT LONG TERM GOAL #1   Title  Pt will demonstrate independence with final HEP    Time  6    Period  Weeks    Status  On-going      PT LONG TERM GOAL #2   Title  Pt will demonstrate 10-12 deg overall improvement in cervical spine ROM with reports of abolishment of occipital headaches     Baseline  Patient subjectively reports "my neck has full motion" and demonstrates 90 degrees rotation right and left.    Time  6    Period  Weeks    Status  Achieved      PT LONG TERM GOAL #3   Title  Pt will report 0/10 dizziness when performing head turns and changes in direction during gait demonstrating improvement in functional mobility     Time  6    Period  Weeks    Status  On-going      PT LONG TERM GOAL #4   Title  Pt will improve gait speed time to >4.37 ft/sec indicating normal walking speed  with no reports of dizziness or headaches     Baseline  4.28 ft/sec  *Met per notes on 09/14/2018    Time  6    Period  Weeks    Status  Achieved      PT LONG TERM GOAL #5   Title  Pt will improve FGA score to >/= to 23/30 to demonstrate low fall risk potential     Baseline  18/30    Time  6    Period  Weeks    Status  On-going       PT LONG TERM GOAL #6   Title  Pt will report ability to drive for short distances around neighborhood with no complaints of dizziness or onset of headaches demonstrating improvement in functional independence    Time  6    Period  Weeks    Status  On-going      PT LONG TERM GOAL #7   Title  Pt will demonstrate a 2-3 line difference in the dynamic visual acuity assessment indicating improvement in VOR    Baseline  3-4 line difference (line 7 and line 3)    Time  6    Period  Weeks    Status  On-going            Plan - 10/05/18 1027    Clinical Impression Statement  Session today heavily focusing on manual therapy with incorporated TDN. Patient with palpable tightness at suboccipital region with pain provocation with pressure and deep palpation. TDN to this area - bilaterally. Patient reporting twitch and cramp as well as palpable reduced muscle tension. Education throughout session on expectations during and following session with good verbal understanding. Much time spent on grade II mobs, STM, manual stretching, and manual trigger point release with no increase in pain or headache. No dizziness provoked with session. Will continue to progress towards goals.     PT Treatment/Interventions  ADLs/Self Care Home Management;Gait training;Neuromuscular re-education;Passive range of motion;Stair training;Functional mobility training;Therapeutic activities;Patient/family education;Therapeutic exercise;Balance training;Vestibular;Canalith Repostioning;Taping;Dry needling;Manual techniques    PT Next Visit Plan  TDN suboccipital mm and anything else in shoulder/cervical spine that is limiting ROM, causing HA.  Review stretches and isolation of cervical flexion/extension with head pressed against ball on wall.  MONDAY: begin to reassess LTG and make plan for hold vs. more visits?    PT Home Exercise Plan  x1 viewing and balance on foam; added tracking ball in various directions while standing on  pillow.  MEDBRIDGE neck exercises: [id number]; emailed ex. progression of patterned background and feet together with EC with head turns on 09-20-18    Consulted and Agree with Plan of Care  Patient       Patient will benefit from skilled therapeutic intervention in order to improve the following deficits and impairments:  Dizziness, Decreased range of motion, Decreased balance, Difficulty walking, Pain, Postural dysfunction  Visit Diagnosis: Unsteadiness on feet  Dizziness and giddiness  Cervicalgia     Problem List Patient Active Problem List   Diagnosis Date Noted  . Vertigo 08/28/2018  . GERD 08/17/2007     Lanney Gins, PT, DPT Supplemental Physical Therapist 10/05/18 10:40 AM Pager: [phone] Office: [facility] [address] [place], [place], [address] Phone: [phone]   Fax:  [phone]  Name: Desiree Gardner MRN: [id number] Date of Birth: [birth year]

## 2018-10-08 ENCOUNTER — Ambulatory Visit: Payer: Managed Care, Other (non HMO) | Admitting: Physical Therapy

## 2018-10-08 DIAGNOSIS — M542 Cervicalgia: Secondary | ICD-10-CM

## 2018-10-08 DIAGNOSIS — R42 Dizziness and giddiness: Secondary | ICD-10-CM | POA: Diagnosis not present

## 2018-10-08 DIAGNOSIS — R2681 Unsteadiness on feet: Secondary | ICD-10-CM

## 2018-10-08 NOTE — Patient Instructions (Addendum)
    Setup  Begin lying on your back with your knees bent and your head on a medium, soft ball Movement  Slowly nod your head up, looking overhead, rolling ball down then return to the starting position, and nod your head down, looking toward your knees rolling ball up.  Hold for 3 seconds. Repeat 10-12 times. Tip  Make sure to keep your neck relaxed and move slowly.   Access Code: [email or web address]  URL: [email or web address]  Date: 10/08/2018  Prepared by: Misty Stanley   Exercises  Standing Upper Cervical Flexion and Extension - 10 reps - 3 sets - 1x daily - 7x weekly  Mid-Lower Cervical Extension SNAG with Strap - 10 reps - 2 sets - 1x daily - 7x weekly  Supine Cervical Flexion Extension on Ball - 10 reps - 2 sets - 1x daily - 7x weekly

## 2018-10-08 NOTE — Therapy (Signed)
[facility] [address] [place], [place], [address] Phone: [phone]   Fax:  [phone]  Physical Therapy Treatment  Patient Details  Name: Desiree Gardner MRN: [id number] Date of Birth: 03/31/1966 Referring Provider (PT): Sarina Ill MD   Encounter Date: 10/08/2018  PT End of Session - 10/08/18 1544    Visit Number  11    Number of Visits  12    Date for PT Re-Evaluation  10/14/18    Authorization Type  [employer] Managed; VL combined PT/OT/ST    Authorization - Visit Number  11    Authorization - Number of Visits  40    PT Start Time  4401    PT Stop Time  1530    PT Time Calculation (min)  45 min    Activity Tolerance  Patient tolerated treatment well;No increased pain    Behavior During Therapy  WFL for tasks assessed/performed       Past Medical History:  Diagnosis Date  . Headache    Dr. Domingo Cocking  . Plantar fasciitis   . PNA (pneumonia) 2011  . Syncope 07/2011   fall, under stress  . Vaso vagal episode   . Vertigo     Past Surgical History:  Procedure Laterality Date  . BREAST REDUCTION SURGERY  11/1986  . BUNIONECTOMY  11/2000  . RADIOLOGY WITH ANESTHESIA N/A 09/25/2018   Procedure: MRI OF THE BRAIN WITH AND WITHOUT CONTRAST AND MRA OF THE BRAIN WITHOUT CONTRAST , MRA OF NECK WITH AND WITHOUT CONSTRAST;  Surgeon: Radiologist, Medication, MD;  Location: [facility];  Service: Radiology;  Laterality: N/A;    There were no vitals filed for this visit.  Subjective Assessment - 10/08/18 1514    Subjective  "I'm a believer in dry needling!"  No headache x 3 days!  Corner balance exercises are not over stimulating now that she does 10 reps head turns instead of 1 minute    Pertinent History  intermittent claudication, plantar fasciitis, syncope, vertigo, PNA    Patient Stated Goals  To get better and be able to drive again (on the highway).    Currently in Pain?  No/denies         [facility] PT Assessment -  10/08/18 1501      AROM   Overall AROM   Deficits    AROM Assessment Site  Cervical    Cervical Flexion  30    Cervical Extension  40    Cervical - Right Side Bend  45    Cervical - Left Side Bend  40    Cervical - Right Rotation  90    Cervical - Left Rotation  90      Functional Gait  Assessment   Gait assessed   Yes    Gait Level Surface  Walks 20 ft in less than 5.5 sec, no assistive devices, good speed, no evidence for imbalance, normal gait pattern, deviates no more than 6 in outside of the 12 in walkway width.    Change in Gait Speed  Able to smoothly change walking speed without loss of balance or gait deviation. Deviate no more than 6 in outside of the 12 in walkway width.    Gait with Horizontal Head Turns  Performs head turns smoothly with no change in gait. Deviates no more than 6 in outside 12 in walkway width    Gait with Vertical Head Turns  Performs task with slight change in gait velocity (eg, minor disruption  to smooth gait path), deviates 6 - 10 in outside 12 in walkway width or uses assistive device    Gait and Pivot Turn  Pivot turns safely within 3 sec and stops quickly with no loss of balance.    Step Over Obstacle  Is able to step over 2 stacked shoe boxes taped together (9 in total height) without changing gait speed. No evidence of imbalance.    Gait with Narrow Base of Support  Is able to ambulate for 10 steps heel to toe with no staggering.    Gait with Eyes Closed  Cannot walk 20 ft without assistance, severe gait deviations or imbalance, deviates greater than 15 in outside 12 in walkway width or will not attempt task.    Ambulating Backwards  Walks 20 ft, no assistive devices, good speed, no evidence for imbalance, normal gait    Steps  Alternating feet, no rail.    Total Score  26    FGA comment:  26/30         Vestibular Assessment - 10/08/18 1509      Visual Acuity   Static  8    Dynamic  8   at 10 feet away to keep consistent with baseline                       PT Education - 10/08/18 1543    Education Details  progress towards LTG, areas to continue to focus on with recertification    Person(s) Educated  Patient    Methods  Explanation    Comprehension  Verbalized understanding       PT Short Term Goals - 09/17/18 0939      PT SHORT TERM GOAL #1   Title  Pt will be independent with performing HEP to further progress towards primary goal of increasing independence with functional mobility    Baseline  dependent     Time  3    Period  Weeks    Status  Achieved      PT SHORT TERM GOAL #2   Title  Pt will participate in functional gait assessment to determine fall risk potential during functional mobility     Baseline  18/30    Time  3    Period  Weeks    Status  Achieved      PT SHORT TERM GOAL #3   Title  Pt will participate in gait speed assessment to determine baseline functional level     Baseline  4.28 ft/sec    Time  3    Period  Weeks    Status  Achieved      PT SHORT TERM GOAL #4   Title  Pt will tolerate x1 viewing in standing to train VOR     Time  3    Period  Weeks    Status  Achieved      PT SHORT TERM GOAL #5   Title  Pt will increase overall cervical ROM by 5 degrees demonstrating improved ROM and reduced stiffness.     Baseline  report back to ROM findings under assessment tab     Time  3    Period  Weeks    Status  Partially Met      PT SHORT TERM GOAL #6   Title  Pt will participate and tolerate a dynamic visual acuity assessment to further assess vestibular hypofunction    Time  3    Period  Weeks  Status  Achieved        PT Long Term Goals - 10/08/18 1452      PT LONG TERM GOAL #1   Title  Pt will demonstrate independence with final HEP    Time  6    Period  Weeks    Status  Achieved      PT LONG TERM GOAL #2   Title  Pt will demonstrate 10-12 deg overall improvement in cervical spine ROM with reports of abolishment of occipital headaches     Baseline   Patient subjectively reports "my neck has full motion" and demonstrates 90 degrees rotation right and left.    Time  6    Period  Weeks    Status  Partially Met      PT LONG TERM GOAL #3   Title  Pt will report 0/10 dizziness when performing head turns and changes in direction during gait demonstrating improvement in functional mobility     Time  6    Period  Weeks    Status  Partially Met      PT LONG TERM GOAL #4   Title  Pt will improve gait speed time to >4.37 ft/sec indicating normal walking speed with no reports of dizziness or headaches     Baseline  4.28 ft/sec  *Met per notes on 09/14/2018    Time  6    Period  Weeks    Status  Achieved      PT LONG TERM GOAL #5   Title  Pt will improve FGA score to >/= to 23/30 to demonstrate low fall risk potential     Baseline  26/30    Time  6    Period  Weeks    Status  Achieved      PT LONG TERM GOAL #6   Title  Pt will report ability to drive for short distances around neighborhood with no complaints of dizziness or onset of headaches demonstrating improvement in functional independence    Time  6    Period  Weeks    Status  Achieved      PT LONG TERM GOAL #7   Title  Pt will demonstrate a 2-3 line difference in the dynamic visual acuity assessment indicating improvement in VOR    Baseline  8, 8 0 line difference    Time  6    Period  Weeks    Status  Achieved        New PT goals for recertification: PT Short Term Goals - 10/08/18 1607      PT SHORT TERM GOAL #1   Title  = LTG      PT Long Term Goals - 10/08/18 1607      PT LONG TERM GOAL #1   Title  Pt will demonstrate independence with final HEP    Time  4    Period  Weeks    Status  Revised    Target Date  11/13/18      PT LONG TERM GOAL #2   Title  Pt will demonstrate 8-10 deg increase in cervical flexion/extension/sidebending with reports of abolishment of occipital headaches     Time  4    Period  Weeks    Status  Revised    Target Date  11/13/18       PT LONG TERM GOAL #5   Title  Pt will improve FGA score to >/= to 28/30 to due to improved stability with removal  of vision and vertical head nods during gait    Baseline  26/30    Time  4    Period  Weeks    Status  Revised    Target Date  11/13/18      PT LONG TERM GOAL #6   Title  Pt will report ability to drive 4-5 [place] on interstate with minimal complaints of dizziness or onset of headaches demonstrating improvement in functional independence    Time  4    Period  Weeks    Status  Revised    Target Date  11/13/18          Plan - 10/08/18 1545    Clinical Impression Statement  Initiated assessment of LTG following TDN.  Pt is making good progress and has met 5/7 LTG; pt partially met cervical spine ROM goal and dizziness intensity with head turns and changes in direction.  Overall pt demonstrates significant improvements in cervical rotation and sidebending ROM but continues to have limited cervical flexion and extension.  Pt reports significant decrease in pain with TDN with 3 consecutive days of no headache.  She also demonstrates increased gait velocity and balance during dynamic gait.  Pt continues to have increased dizziness and LOB when vision is removed and when performing vertical head turns during functional mobility.   Pt will benefit from ongoing skilled PT services to address ROM, balance, and vestibular impairments to maximize functional mobility independence and decrease pain.    Rehab Potential  Good    PT Frequency  2x / week    PT Duration  4 weeks    PT Treatment/Interventions  ADLs/Self Care Home Management;Gait training;Neuromuscular re-education;Passive range of motion;Stair training;Functional mobility training;Therapeutic activities;Patient/family education;Therapeutic exercise;Balance training;Vestibular;Canalith Repostioning;Taping;Dry needling;Manual techniques    PT Next Visit Plan  TDN suboccipital mm and anything else in shoulder/cervical spine that is  limiting ROM, causing HA.  Continue to address dynamic balance - eyes closed, vertical head turns, progress x 1 viewing.  [place] she [place] on interstate [place] one [place]?    PT Home Exercise Plan  x1 viewing and balance on foam; added tracking ball in various directions while standing on pillow.  MEDBRIDGE neck exercises: [id number]; emailed ex. progression of patterned background and feet together with EC with head turns on 09-20-18    Consulted and Agree with Plan of Care  Patient       Patient will benefit from skilled therapeutic intervention in order to improve the following deficits and impairments:  Dizziness, Decreased range of motion, Decreased balance, Difficulty walking, Pain, Postural dysfunction  Visit Diagnosis: Cervicalgia  Dizziness and giddiness  Unsteadiness on feet     Problem List Patient Active Problem List   Diagnosis Date Noted  . Vertigo 08/28/2018  . GERD 08/17/2007    Rico Junker, PT, DPT 10/08/18    4:05 PM    [facility] [address] [place], [place], [address] Phone: [phone]   Fax:  [phone]  Name: Sharita Bienaime MRN: [id number] Date of Birth: 1966-09-28

## 2018-10-12 ENCOUNTER — Encounter: Payer: Self-pay | Admitting: Physical Therapy

## 2018-10-12 ENCOUNTER — Ambulatory Visit: Payer: Managed Care, Other (non HMO) | Attending: Internal Medicine | Admitting: Physical Therapy

## 2018-10-12 DIAGNOSIS — M542 Cervicalgia: Secondary | ICD-10-CM | POA: Diagnosis not present

## 2018-10-12 DIAGNOSIS — R42 Dizziness and giddiness: Secondary | ICD-10-CM | POA: Insufficient documentation

## 2018-10-12 DIAGNOSIS — R2681 Unsteadiness on feet: Secondary | ICD-10-CM | POA: Diagnosis present

## 2018-10-12 NOTE — Therapy (Signed)
[facility] [address] [place], [place], [address] Phone: [phone]   Fax:  [phone]  Physical Therapy Treatment  Patient Details  Name: Desiree Gardner MRN: [id number] Date of Birth: October 08, 1966 Referring Provider (PT): Sarina Ill MD   Encounter Date: 10/12/2018  PT End of Session - 10/12/18 1644    Visit Number  12    Number of Visits  20    Date for PT Re-Evaluation  11/13/18    Authorization Type  [employer] Managed; VL combined PT/OT/ST    Authorization - Visit Number  12    Authorization - Number of Visits  40    PT Start Time  1448    PT Stop Time  1538   10 min moist heat   PT Time Calculation (min)  50 min    Activity Tolerance  Patient tolerated treatment well;No increased pain    Behavior During Therapy  WFL for tasks assessed/performed       Past Medical History:  Diagnosis Date  . Headache    Dr. Domingo Cocking  . Plantar fasciitis   . PNA (pneumonia) 2011  . Syncope 07/2011   fall, under stress  . Vaso vagal episode   . Vertigo     Past Surgical History:  Procedure Laterality Date  . BREAST REDUCTION SURGERY  11/1986  . BUNIONECTOMY  11/2000  . RADIOLOGY WITH ANESTHESIA N/A 09/25/2018   Procedure: MRI OF THE BRAIN WITH AND WITHOUT CONTRAST AND MRA OF THE BRAIN WITHOUT CONTRAST , MRA OF NECK WITH AND WITHOUT CONSTRAST;  Surgeon: Radiologist, Medication, MD;  Location: [facility];  Service: Radiology;  Laterality: N/A;    There were no vitals filed for this visit.  Subjective Assessment - 10/12/18 1643    Subjective  feeling well - no headaches in ~1 week. Is going to dry driving on highway this weekend    Pertinent History  intermittent claudication, plantar fasciitis, syncope, vertigo, PNA    Diagnostic tests  Scheduled for MRI under sedation in October to rule out vascular causes or tumor, CT was WNL    Patient Stated Goals  To get better and be able to drive again (on the highway).    Currently  in Pain?  No/denies    Pain Score  0-No pain                       [facility] Adult PT Treatment/Exercise - 10/12/18 0001      Moist Heat Therapy   Number Minutes Moist Heat  10 Minutes    Moist Heat Location  Cervical;Shoulder      Manual Therapy   Manual Therapy  Joint mobilization;Soft tissue mobilization;Myofascial release    Manual therapy comments  patient in supine and prone    Joint Mobilization  grade II-III CPAs to upper thoracic spine    Soft tissue mobilization  STM to B UT, B LS, B infra, B thoracic and cervical paraspinals    Myofascial Release  manual trigger point release to B UT      Neck Exercises: Stretches   Upper Trapezius Stretch  Right;Left;2 reps;30 seconds    Levator Stretch  Right;Left;2 reps;30 seconds    Other Neck Stretches  open book 5 x 10 sec each side for thoracic mobility       Trigger Point Dry Needling - 10/12/18 1646    Consent Given?  Yes    Muscles Treated Upper Body  Upper trapezius  bilateral   Upper Trapezius Response  Twitch reponse elicited;Palpable increased muscle length             PT Short Term Goals - 10/08/18 1607      PT SHORT TERM GOAL #1   Title  = LTG        PT Long Term Goals - 10/08/18 1607      PT LONG TERM GOAL #1   Title  Pt will demonstrate independence with final HEP    Time  4    Period  Weeks    Status  Revised    Target Date  11/13/18      PT LONG TERM GOAL #2   Title  Pt will demonstrate 8-10 deg increase in cervical flexion/extension/sidebending with reports of abolishment of occipital headaches     Time  4    Period  Weeks    Status  Revised    Target Date  11/13/18      PT LONG TERM GOAL #5   Title  Pt will improve FGA score to >/= to 28/30 to due to improved stability with removal of vision and vertical head nods during gait    Baseline  26/30    Time  4    Period  Weeks    Status  Revised    Target Date  11/13/18      PT LONG TERM GOAL #6   Title  Pt will report  ability to drive 4-5 exits on interstate with minimal complaints of dizziness or onset of headaches demonstrating improvement in functional independence    Time  4    Period  Weeks    Status  Revised    Target Date  11/13/18            Plan - 10/12/18 1644    Clinical Impression Statement  Patient doing well today - continues to report good improvement in headache and neck pain symptoms. Does report tightness at B UT today with TDN performed in this region. Good twitch response bilaterally with improved tissuw quailty following treatment. Updated HEP to include thoracic rotation stretch, UT and LS stretch with good tolerance and carryover. patient continues to do well and is quite pleased with current progress.     Rehab Potential  Good    PT Frequency  2x / week    PT Duration  4 weeks    PT Treatment/Interventions  ADLs/Self Care Home Management;Gait training;Neuromuscular re-education;Passive range of motion;Stair training;Functional mobility training;Therapeutic activities;Patient/family education;Therapeutic exercise;Balance training;Vestibular;Canalith Repostioning;Taping;Dry needling;Manual techniques    PT Next Visit Plan  TDN suboccipital mm and anything else in shoulder/cervical spine that is limiting ROM, causing HA.  Continue to address dynamic balance - eyes closed, vertical head turns, progress x 1 viewing.  Has she tried driving on interstate for one exit?    PT Home Exercise Plan  x1 viewing and balance on foam; added tracking ball in various directions while standing on pillow.  MEDBRIDGE neck exercises: [id number]; emailed ex. progression of patterned background and feet together with EC with head turns on 09-20-18    Consulted and Agree with Plan of Care  Patient       Patient will benefit from skilled therapeutic intervention in order to improve the following deficits and impairments:  Dizziness, Decreased range of motion, Decreased balance, Difficulty walking, Pain,  Postural dysfunction  Visit Diagnosis: Cervicalgia  Dizziness and giddiness  Unsteadiness on feet     Problem List Patient Active Problem List  Diagnosis Date Noted  . Vertigo 08/28/2018  . GERD 08/17/2007     Lanney Gins, PT, DPT Supplemental Physical Therapist 10/12/18 4:53 PM Pager: [phone] Office: [facility] [address] [place], [place], [address] Phone: [phone]   Fax:  [phone]  Name: Athalee Esterline MRN: [id number] Date of Birth: Oct 05, 1966

## 2018-10-15 ENCOUNTER — Encounter: Payer: Self-pay | Admitting: Rehabilitative and Restorative Service Providers"

## 2018-10-15 ENCOUNTER — Ambulatory Visit: Payer: Managed Care, Other (non HMO) | Admitting: Rehabilitative and Restorative Service Providers"

## 2018-10-15 DIAGNOSIS — R42 Dizziness and giddiness: Secondary | ICD-10-CM

## 2018-10-15 DIAGNOSIS — M542 Cervicalgia: Secondary | ICD-10-CM | POA: Diagnosis not present

## 2018-10-15 DIAGNOSIS — R2681 Unsteadiness on feet: Secondary | ICD-10-CM

## 2018-10-15 NOTE — Therapy (Signed)
[facility] [address] [place], [place], [address] Phone: [phone]   Fax:  [phone]  Physical Therapy Treatment  Patient Details  Name: Desiree Gardner MRN: [id number] Date of Birth: 07/12/1966 Referring Provider (PT): Sarina Ill MD   Encounter Date: 10/15/2018  PT End of Session - 10/15/18 1308    Visit Number  13    Number of Visits  20    Date for PT Re-Evaluation  11/13/18    Authorization Type  [employer] Managed; VL combined PT/OT/ST    Authorization - Visit Number  12    Authorization - Number of Visits  40    PT Start Time  0848    PT Stop Time  0930    PT Time Calculation (min)  42 min    Activity Tolerance  Patient tolerated treatment well;No increased pain    Behavior During Therapy  WFL for tasks assessed/performed       Past Medical History:  Diagnosis Date  . Headache    Dr. Domingo Cocking  . Plantar fasciitis   . PNA (pneumonia) 2011  . Syncope 07/2011   fall, under stress  . Vaso vagal episode   . Vertigo     Past Surgical History:  Procedure Laterality Date  . BREAST REDUCTION SURGERY  11/1986  . BUNIONECTOMY  11/2000  . RADIOLOGY WITH ANESTHESIA N/A 09/25/2018   Procedure: MRI OF THE BRAIN WITH AND WITHOUT CONTRAST AND MRA OF THE BRAIN WITHOUT CONTRAST , MRA OF NECK WITH AND WITHOUT CONSTRAST;  Surgeon: Radiologist, Medication, MD;  Location: [facility];  Service: Radiology;  Laterality: N/A;    There were no vitals filed for this visit.  Subjective Assessment - 10/15/18 0848    Subjective  The patient reports she is coming down with a cold and that is giving her head pressure.  She was able to work all day  (12+ hours) and attend a dinner without pain.  She notes "my posture is a lot better".  She had to mount items to a wall.  "it's hard to say how I'm doing because I'm getting sick."    Pertinent History  intermittent claudication, plantar fasciitis, syncope, vertigo, PNA    Patient Stated  Goals  To get better and be able to drive again (on the highway).    Currently in Pain?  No/denies                       [facility] Adult PT Treatment/Exercise - 10/15/18 1301      Self-Care   Self-Care  Other Self-Care Comments    Other Self-Care Comments   Discussed continuing current HEP and patient discussed stress management as a goal for maintaining gains.       Neuro Re-ed    Neuro Re-ed Details   Patient performed standing on compliant surfaces (foam) with eyes closed with wide base of support with increased sway needing CGA for recovery.  Worked on pillow standing with feet wide base of support for HEP, then moved to more narrow on pillow.  Also performed head motion horizontal and vertical plane when standing on pillow with eyes closed.   Walking near support (wall) with finger tip touch during gait with eyes closed.  Discussed other prior HEP and patient and PT consolidated to 3 balance activities to continue.        Exercises   Exercises  Other Exercises    Other Exercises   Discussed home exercises  and patient requests a consolidated handout.               PT Short Term Goals - 10/08/18 1607      PT SHORT TERM GOAL #1   Title  = LTG        PT Long Term Goals - 10/08/18 1607      PT LONG TERM GOAL #1   Title  Pt will demonstrate independence with final HEP    Time  4    Period  Weeks    Status  Revised    Target Date  11/13/18      PT LONG TERM GOAL #2   Title  Pt will demonstrate 8-10 deg increase in cervical flexion/extension/sidebending with reports of abolishment of occipital headaches     Time  4    Period  Weeks    Status  Revised    Target Date  11/13/18      PT LONG TERM GOAL #5   Title  Pt will improve FGA score to >/= to 28/30 to due to improved stability with removal of vision and vertical head nods during gait    Baseline  26/30    Time  4    Period  Weeks    Status  Revised    Target Date  11/13/18      PT LONG TERM GOAL  #6   Title  Pt will report ability to drive 4-5 exits on interstate with minimal complaints of dizziness or onset of headaches demonstrating improvement in functional independence    Time  4    Period  Weeks    Status  Revised    Target Date  11/13/18            Plan - 10/15/18 1761    Clinical Impression Statement  The patient continues with progress on HA, dizziness and overall mobility.   Today's session emphasized balance HEP to reduce reliance on visual cues for balance.  Continue working towards updated LTGs.     PT Treatment/Interventions  ADLs/Self Care Home Management;Gait training;Neuromuscular re-education;Passive range of motion;Stair training;Functional mobility training;Therapeutic activities;Patient/family education;Therapeutic exercise;Balance training;Vestibular;Canalith Repostioning;Taping;Dry needling;Manual techniques    PT Next Visit Plan  CONSOLIDATE/ ADD PICS TO CURRENT HEP FOR recently added exercises (trunk rotation supine, door frame stretch, and prone on elbows with chin tuck); TDN suboccipital mm and anything else in shoulder/cervical spine that is limiting ROM, causing HA.  Continue to address dynamic balance - eyes closed, vertical head turns, progress x 1 viewing.  Has she tried driving on interstate for one exit?    Consulted and Agree with Plan of Care  Patient       Patient will benefit from skilled therapeutic intervention in order to improve the following deficits and impairments:  Dizziness, Decreased range of motion, Decreased balance, Difficulty walking, Pain, Postural dysfunction  Visit Diagnosis: Cervicalgia  Dizziness and giddiness  Unsteadiness on feet     Problem List Patient Active Problem List   Diagnosis Date Noted  . Vertigo 08/28/2018  . GERD 08/17/2007    Havyn Ramo, PT 10/15/2018, 1:12 PM  [facility] [address] [place], [place], [address] Phone:  [phone]   Fax:  [phone]  Name: Keyira Mondesir MRN: [id number] Date of Birth: [birth year]

## 2018-10-15 NOTE — Patient Instructions (Addendum)
Access Code: [email or web address]  URL: [email or web address]  Date: 10/15/2018  Prepared by: Rudell Cobb   Exercises Standing Upper Cervical Flexion and Extension - 10 reps - 3 sets - 1x daily - 7x weekly Mid-Lower Cervical Extension SNAG with Strap - 10 reps - 2 sets - 1x daily - 7x weekly Romberg Stance Eyes Closed on Foam Pad - 10 reps - 3 sets - 30 seconds hold - 1x daily - 7x weekly Wide Stance with Head Nods on Foam Pad - 5 reps - 2 sets - 1x daily - 7x weekly Wide Stance with Head Rotation on Foam Pad - 10 reps - 1 sets - 1x daily - 7x weekly Walking with Eyes Closed and Counter Support - 3 sets - 1x daily - 7x weekly

## 2018-10-18 ENCOUNTER — Encounter

## 2018-10-18 ENCOUNTER — Ambulatory Visit: Payer: Managed Care, Other (non HMO) | Admitting: Neurology

## 2018-10-19 ENCOUNTER — Ambulatory Visit: Payer: Managed Care, Other (non HMO) | Admitting: Physical Therapy

## 2018-10-19 ENCOUNTER — Encounter: Payer: Self-pay | Admitting: Physical Therapy

## 2018-10-19 DIAGNOSIS — M542 Cervicalgia: Secondary | ICD-10-CM

## 2018-10-19 DIAGNOSIS — R42 Dizziness and giddiness: Secondary | ICD-10-CM

## 2018-10-19 DIAGNOSIS — R2681 Unsteadiness on feet: Secondary | ICD-10-CM
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
## 2018-10-22 ENCOUNTER — Encounter: Payer: Self-pay | Admitting: Neurology

## 2018-10-22 ENCOUNTER — Ambulatory Visit: Payer: Managed Care, Other (non HMO) | Admitting: Neurology

## 2018-10-22 VITALS — BP 118/79 | HR 74 | Ht 65.5 in | Wt 143.2 lb

## 2018-10-22 DIAGNOSIS — M7918 Myalgia, other site: Secondary | ICD-10-CM | POA: Diagnosis not present

## 2018-10-22 DIAGNOSIS — R42 Dizziness and giddiness: Secondary | ICD-10-CM

## 2018-10-22 NOTE — Progress Notes (Signed)
[facility]    Provider:  Dr Jaynee Eagles Referring Provider: Prince Solian, MD Primary Care Physician:  Prince Solian, MD  CC:  Dizziness  Interval history 10/22/2018: She is feeling better. Stress is trigger for the pain in the neck. She has been having headaches. Eventually they helped her work out her neck tightness and now her neck tightness feels better. She loves dry needling. Headaches have improved, she is recognizing prompts of stress which is helping. We discussed migraines, ddx including vestibular migraines. Husband also provides information. At this time she is feeling better, will continue on current treatment and declies medications  HPI:  Desiree Gardner is a 52 y.o. female here as requested by Dr. Dagmar Hait for dizziness. First episode over a year ago, she was driving and felt like wind was pushing her head.  PMHx dizziness, vertigo, alcohol abuse. Last episode, recently last week she flew to [place], there was stress, AC in the office was out, she woke up the next day and about 10am she felt "off" maybe dizzy, she she got up to walk down the hallway she was dizzy and her body was being pulled to the left, then the room started spinning or she was spinning, she had to hold on the desk, putting her head down made it completely worse, no inciting events, no problem on the flight, she had to have people help her walk, she had to sit in one position, severe nausea, light sensitivity, neck stiffness, tylenol, all headaches are the back of her head and neck pain center base of skull, strain constant, +photophobia, +nausea, movement made it worse. Tylenol helped. Very fatigued afterwards when she got home from [place]. She continues to have symptoms and felt their was "blood flow issue". No other focal neurologic deficits. She is having moments she is working harder to work, she feels tird and "drawn".  She has had prior incidents of not feeling right, heat, like her "brain was  melting", over labor day weekend, she felt hot, no loss of consciousness, she has a "high threshold to pain", No hx of migraines in the family.   Reviewed notes, labs and imaging from outside physicians, which showed: reviewed Dr. Danna Hefty notes. Patient with episodes of vertigo. ntense pressure in the head. Body feels like it is "pulling to the right". She couldn't walk straight, couldn;t hold head straight with associated headache. Hx of vertigo starting a year ago. Ultimately went to h/a clinic after a series of testing. She declined starting AED (topiramate?). Recent increase in stress. She was offered valium and phenergan for her episodes. Benadryl made her sedated. MRI brain was ordered, ENT was considered, most recent episode after flying to [place].   08/23/2018: BMP nml (BUN 13, Creat 0.8)  CT 05/02/2017 showed No acute intracranial abnormalities including mass lesion or mass effect, hydrocephalus, extra-axial fluid collection, midline shift, hemorrhage, or acute infarction, large ischemic events (personally reviewed images)     Review of Systems: Patient complains of symptoms per HPI as well as the following symptoms: headache, weakness, dizziness, fatigue, spinning sensation. Pertinent negatives and positives per HPI. All others negative.   Social History   Socioeconomic History  . Marital status: Married    Spouse name: Not on file  . Number of children: 1  . Years of education: Not on file  . Highest education level: Master's degree (e.g., MA, MS, MEng, MEd, MSW, MBA)  Occupational History  . Not on file  Social Needs  . Financial resource strain: Not  on file  . Food insecurity:    Worry: Not on file    Inability: Not on file  . Transportation needs:    Medical: Not on file    Non-medical: Not on file  Tobacco Use  . Smoking status: Former Research scientist (life sciences)  . Smokeless tobacco: Never Used  . Tobacco comment: smoked cigarette a couple of times in high school  Substance and Sexual  Activity  . Alcohol use: Yes    Comment: occasionally, not even weekly  . Drug use: Never  . Sexual activity: Not on file  Lifestyle  . Physical activity:    Days per week: Not on file    Minutes per session: Not on file  . Stress: Not on file  Relationships  . Social connections:    Talks on phone: Not on file    Gets together: Not on file    Attends religious service: Not on file    Active member of club or organization: Not on file    Attends meetings of clubs or organizations: Not on file    Relationship status: Not on file  . Intimate partner violence:    Fear of current or ex partner: Not on file    Emotionally abused: Not on file    Physically abused: Not on file    Forced sexual activity: Not on file  Other Topics Concern  . Not on file  Social History Narrative   Lives at home with spouse & son   Right handed   Caffeine: 2 cups of coffee/day    Family History  Problem Relation Age of Onset  . Dementia Mother   . Kidney disease Father        on Dialysis, stage IV  . Osteoporosis Sister   . Glaucoma Sister   . Other Brother        prediabetic  . Multiple myeloma Paternal Grandmother   . Diabetes type II Paternal Grandfather   . Dementia Paternal Grandfather   . Alzheimer's disease Paternal Grandfather     Past Medical History:  Diagnosis Date  . Headache    Dr. Domingo Cocking  . Plantar fasciitis   . PNA (pneumonia) 2011  . Syncope 07/2011   fall, under stress  . Vaso vagal episode   . Vertigo     Past Surgical History:  Procedure Laterality Date  . BREAST REDUCTION SURGERY  11/1986  . BUNIONECTOMY  11/2000  . RADIOLOGY WITH ANESTHESIA N/A 09/25/2018   Procedure: MRI OF THE BRAIN WITH AND WITHOUT CONTRAST AND MRA OF THE BRAIN WITHOUT CONTRAST , MRA OF NECK WITH AND WITHOUT CONSTRAST;  Surgeon: Radiologist, Medication, MD;  Location: [facility];  Service: Radiology;  Laterality: N/A;    Current Outpatient Medications  Medication Sig Dispense Refill  .  acetaminophen (TYLENOL) 500 MG tablet Take 1,000 mg by mouth 2 (two) times daily as needed for moderate pain or headache.    . diphenhydrAMINE (BENADRYL) 25 MG tablet Take 50 mg by mouth at bedtime.    . fluticasone (FLONASE SENSIMIST) 27.5 MCG/SPRAY nasal spray Place 1 spray into the nose daily.    Marland Kitchen levonorgestrel (MIRENA) 20 MCG/24HR IUD 1 each by Intrauterine route once.    . naproxen sodium (ALEVE) 220 MG tablet Take 440 mg by mouth daily as needed (pain).    . Propylene Glycol (SYSTANE COMPLETE) 0.6 % SOLN Place 1 drop into the left eye daily.    Marland Kitchen UNABLE TO FIND Med Name: Allergy Shots  No current facility-administered medications for this visit.     Allergies as of 10/22/2018 - Review Complete 10/22/2018  Allergen Reaction Noted  . Sulfonamide derivatives Shortness Of Breath 08/17/2007  . Codeine  08/17/2007  . Mold extract [trichophyton]  08/27/2018  . Pollen extract  08/27/2018  . Penicillins Rash 08/17/2007    Vitals: BP 118/79   Pulse 74   Ht 5' 5.5" (1.664 m)   Wt 143 lb 3.2 oz (65 kg)   BMI 23.47 kg/m  Last Weight:  Wt Readings from Last 1 Encounters:  10/22/18 143 lb 3.2 oz (65 kg)   Last Height:   Ht Readings from Last 1 Encounters:  10/22/18 5' 5.5" (1.664 m)   Physical exam: Exam: Gen: Anxious, conversant, well nourised, thin, well groomed                     CV: RRR, no MRG. No Carotid Bruits. No peripheral edema, warm, nontender Eyes: Conjunctivae clear without exudates or hemorrhage  Neuro: Detailed Neurologic Exam  Speech:    Speech is normal; fluent and spontaneous with normal comprehension.  Cognition:    The patient is oriented to person, place, and time;     recent and remote memory intact;     language fluent;     normal attention, concentration,     fund of knowledge Cranial Nerves:    The pupils are equal, round, and reactive to light. The fundi are normal and spontaneous venous pulsations are present. Visual fields are full to  finger confrontation. Extraocular movements are intact. Trigeminal sensation is intact and the muscles of mastication are normal. The face is symmetric. The palate elevates in the midline. Hearing intact. Voice is normal. Shoulder shrug is normal. The tongue has normal motion without fasciculations.   Coordination:    Normal finger to nose and heel to shin. Normal rapid alternating movements.   Gait:    Heel-toe and tandem gait are normal.   Motor Observation:    No asymmetry, no atrophy, and no involuntary movements noted. Tone:    Normal muscle tone.    Posture:    Posture is normal. normal erect    Strength:    Strength is V/V in the upper and lower limbs.      Sensation: intact to LT     Reflex Exam:  DTR's:    Deep tendon reflexes in the upper and lower extremities are normal bilaterally.   Toes:    The toes are downgoing bilaterally.   Clonus:    Clonus is absent.   123/82  132/80  121/79     BP Location  LeftArm  LeftArm  LeftArm   Patient Position  Sitting  Standing  Standing   Pulse  79  79  83        Assessment/Plan:  Patient with acute onset vertigo, ataxia, dizziness, neck pain need work up to evaluate for stroke and carotid/vertebral dissection. Tried to have MRI with benzo medication but had a panic attack  Vestibular therapy for vertigo and ry needling: improved neck tightness and vertigo. Consider cervicogenic dizziness or PPPD, provided literature.   Vascular dissection/stroke vs Vestibular migraine vs Vestibular neuritis or Benign Positional Vertigo. Also may consider vaso vagal syncope and extreme anxiety.  MRI brain/MRA head normal  Vestibular therapy for vertigo and ry needling: improved neck tightness and vertigo.   Discussed: To prevent or relieve headaches, try the following: Cool Compress. Lie down and place a cool compress  on your head.  Avoid headache triggers. If certain foods or odors seem to have triggered your migraines  in the past, avoid them. A headache diary might help you identify triggers.  Include physical activity in your daily routine. Try a daily walk or other moderate aerobic exercise.  Manage stress. Find healthy ways to cope with the stressors, such as delegating tasks on your to-do list.  Practice relaxation techniques. Try deep breathing, yoga, massage and visualization.  Eat regularly. Eating regularly scheduled meals and maintaining a healthy diet might help prevent headaches. Also, drink plenty of fluids.  Follow a regular sleep schedule. Sleep deprivation might contribute to headaches Consider biofeedback. With this mind-body technique, you learn to control certain bodily functions - such as muscle tension, heart rate and blood pressure - to prevent headaches or reduce headache pain.    Proceed to emergency room if you experience new or worsening symptoms or symptoms do not resolve, if you have new neurologic symptoms or if headache is severe, or for any concerning symptom.   Provided education and documentation from American headache Society toolbox including articles on: chronic migraine medication overuse headache, chronic migraines, prevention of migraines, behavioral and other nonpharmacologic treatments for headache.     A total of 25 minutes was spent face-to-face with this patient. Over half this time was spent on counseling patient on the  1. Vertigo   2. Myofascial pain syndrome, cervical    diagnosis and different diagnostic and therapeutic options, counseling and coordination of care, risks ans benefits of management, compliance, or risk factor reduction and education.      Cc:  Prince Solian, MD  Sarina Ill, MD  [facility] [address] [place], [place] [address]  Phone [phone] Fax [phone]

## 2018-10-23 DIAGNOSIS — M7918 Myalgia, other site: Secondary | ICD-10-CM | POA: Insufficient documentation

## 2018-10-26 ENCOUNTER — Ambulatory Visit: Payer: Managed Care, Other (non HMO) | Admitting: Physical Therapy

## 2018-10-26 ENCOUNTER — Encounter: Payer: Self-pay | Admitting: Physical Therapy

## 2018-10-26 DIAGNOSIS — R2681 Unsteadiness on feet: Secondary | ICD-10-CM

## 2018-10-26 DIAGNOSIS — R42 Dizziness and giddiness: Secondary | ICD-10-CM

## 2018-10-26 DIAGNOSIS — M542 Cervicalgia: Secondary | ICD-10-CM | POA: Diagnosis not present

## 2018-10-26 NOTE — Therapy (Signed)
[facility] [address] [place], [place], [address] Phone: [phone]   Fax:  [phone]  Physical Therapy Treatment  Patient Details  Name: Desiree Gardner MRN: [id number] Date of Birth: March 01, 1966 Referring Provider (PT): Sarina Ill MD   Encounter Date: 10/26/2018  PT End of Session - 10/26/18 0936    Visit Number  15    Number of Visits  20    Date for PT Re-Evaluation  11/13/18    Authorization Type  [employer] Managed; VL combined PT/OT/ST    Authorization - Visit Number  14    Authorization - Number of Visits  40    PT Start Time  [phone]    PT Stop Time  1040   10 min moist heat   PT Time Calculation (min)  52 min    Activity Tolerance  Patient tolerated treatment well    Behavior During Therapy  [facility] for tasks assessed/performed       Past Medical History:  Diagnosis Date  . Headache    Dr. Domingo Cocking  . Plantar fasciitis   . PNA (pneumonia) 2011  . Syncope 07/2011   fall, under stress  . Vaso vagal episode   . Vertigo     Past Surgical History:  Procedure Laterality Date  . BREAST REDUCTION SURGERY  11/1986  . BUNIONECTOMY  11/2000  . RADIOLOGY WITH ANESTHESIA N/A 09/25/2018   Procedure: MRI OF THE BRAIN WITH AND WITHOUT CONTRAST AND MRA OF THE BRAIN WITHOUT CONTRAST , MRA OF NECK WITH AND WITHOUT CONSTRAST;  Surgeon: Radiologist, Medication, MD;  Location: [facility];  Service: Radiology;  Laterality: N/A;    There were no vitals filed for this visit.  Subjective Assessment - 10/26/18 0931    Subjective  seen by Dr. Jaynee Eagles this week - reviewed MRI results; feels as if this is something patient is going to have to manage long term; driving on interstate was unsuccessful.    Pertinent History  intermittent claudication, plantar fasciitis, syncope, vertigo, PNA    Patient Stated Goals  To get better and be able to drive again (on the highway).    Currently in Pain?  Yes    Pain Score  3     Pain Location   Neck    Pain Orientation  Posterior    Pain Descriptors / Indicators  Aching;Sore    Pain Type  Chronic pain                       [facility] Adult PT Treatment/Exercise - 10/26/18 0001      Modalities   Modalities  Moist Heat      Moist Heat Therapy   Number Minutes Moist Heat  10 Minutes    Moist Heat Location  Cervical;Shoulder      Manual Therapy   Manual Therapy  Soft tissue mobilization;Myofascial release    Manual therapy comments  patient prone; skillful palpation with TDN    Soft tissue mobilization  STM to entierty of upper back mm; trigger points/taut bandsmost noticable in B UE to R>L    Myofascial Release  manual trigger point release to B UT       Trigger Point Dry Needling - 10/26/18 0950    Consent Given?  Yes    Muscles Treated Upper Body  Upper trapezius   R only   Upper Trapezius Response  Twitch reponse elicited;Palpable increased muscle length        Balance Exercises -  10/26/18 0948      Balance Exercises: Standing   Tandem Stance  Eyes open;2 reps;30 secs   modified to 3/4 with progression to horiz/vert head turns   Other Standing Exercises  gait with ball toss 2 x 30 feet; gait with gaze fixation on ball (circles) 2 x 30 feet          PT Short Term Goals - 10/08/18 1607      PT SHORT TERM GOAL #1   Title  = LTG        PT Long Term Goals - 10/08/18 1607      PT LONG TERM GOAL #1   Title  Pt will demonstrate independence with final HEP    Time  4    Period  Weeks    Status  Revised    Target Date  11/13/18      PT LONG TERM GOAL #2   Title  Pt will demonstrate 8-10 deg increase in cervical flexion/extension/sidebending with reports of abolishment of occipital headaches     Time  4    Period  Weeks    Status  Revised    Target Date  11/13/18      PT LONG TERM GOAL #5   Title  Pt will improve FGA score to >/= to 28/30 to due to improved stability with removal of vision and vertical head nods during gait    Baseline   26/30    Time  4    Period  Weeks    Status  Revised    Target Date  11/13/18      PT LONG TERM GOAL #6   Title  Pt will report ability to drive 4-5 exits on interstate with minimal complaints of dizziness or onset of headaches demonstrating improvement in functional independence    Time  4    Period  Weeks    Status  Revised    Target Date  11/13/18            Plan - 10/26/18 0936    Clinical Impression Statement  Patient seen by Dr. Jaynee Eagles this week - pleased with MRI results and feels as if this is something patient is going to have to manage long term. Patient reporitng headache and dizziness this week due to long work hours, stress, and poor sleeping patterns. Patient with some onset of dizziness this session with education on progressing to tandem gait at home in a safe manner with good understanding. Patient may benefit form education on availability of other options such as talk therapy at next session due to reported high stress levels being a known trigger for pain/dizziness.     PT Treatment/Interventions  ADLs/Self Care Home Management;Gait training;Neuromuscular re-education;Passive range of motion;Stair training;Functional mobility training;Therapeutic activities;Patient/family education;Therapeutic exercise;Balance training;Vestibular;Canalith Repostioning;Taping;Dry needling;Manual techniques    PT Next Visit Plan  TDN suboccipital mm and anything else in shoulder/cervical spine that is limiting ROM, causing HA.  Continue to address dynamic balance - eyes closed, vertical head turns, progress x 1 viewing.  Has she tried driving on interstate for one exit?    PT Home Exercise Plan  x1 viewing and balance on foam; added tracking ball in various directions while standing on pillow.  MEDBRIDGE neck exercises: [id number]; emailed ex. progression of patterned background and feet together with EC with head turns on 09-20-18    Consulted and Agree with Plan of Care  Patient        Patient will benefit from skilled  therapeutic intervention in order to improve the following deficits and impairments:  Dizziness, Decreased range of motion, Decreased balance, Difficulty walking, Pain, Postural dysfunction  Visit Diagnosis: Cervicalgia  Dizziness and giddiness  Unsteadiness on feet     Problem List Patient Active Problem List   Diagnosis Date Noted  . Myofascial pain syndrome, cervical 10/23/2018  . Vertigo 08/28/2018  . GERD 08/17/2007     Lanney Gins, PT, DPT Supplemental Physical Therapist 10/26/18 9:51 AM Pager: [phone] Office: [facility] [address] [place], [place], [address] Phone: [phone]   Fax:  [phone]  Name: Desiree Gardner MRN: [id number] Date of Birth: 03/22/1966

## 2018-10-29 ENCOUNTER — Ambulatory Visit: Payer: Managed Care, Other (non HMO) | Admitting: Physical Therapy

## 2018-11-02 ENCOUNTER — Ambulatory Visit: Payer: Managed Care, Other (non HMO) | Admitting: Physical Therapy

## 2018-11-05 ENCOUNTER — Ambulatory Visit: Payer: Managed Care, Other (non HMO) | Admitting: Physical Therapy

## 2018-11-05 DIAGNOSIS — M542 Cervicalgia: Secondary | ICD-10-CM | POA: Diagnosis not present

## 2018-11-05 DIAGNOSIS — R42 Dizziness and giddiness: Secondary | ICD-10-CM

## 2018-11-05 DIAGNOSIS — R2681 Unsteadiness on feet: Secondary | ICD-10-CM

## 2018-11-06 NOTE — Therapy (Signed)
[facility] [address] [place], [place], [address] Phone: [phone]   Fax:  [phone]  Physical Therapy Treatment/Discharge Summary  Patient Details  Name: Desiree Gardner MRN: [id number] Date of Birth: 1966-03-03 Referring Provider (PT): Sarina Ill MD   Encounter Date: 11/05/2018  PT End of Session - 11/06/18 2017    Visit Number  16    Number of Visits  153    Date for PT Re-Evaluation  11/13/18    Authorization Type  [employer] Managed; VL combined PT/OT/ST    Authorization - Visit Number  15    Authorization - Number of Visits  40    PT Start Time  2094    PT Stop Time  7096    PT Time Calculation (min)  46 min       Past Medical History:  Diagnosis Date  . Headache    Dr. Domingo Cocking  . Plantar fasciitis   . PNA (pneumonia) 2011  . Syncope 07/2011   fall, under stress  . Vaso vagal episode   . Vertigo     Past Surgical History:  Procedure Laterality Date  . BREAST REDUCTION SURGERY  11/1986  . BUNIONECTOMY  11/2000  . RADIOLOGY WITH ANESTHESIA N/A 09/25/2018   Procedure: MRI OF THE BRAIN WITH AND WITHOUT CONTRAST AND MRA OF THE BRAIN WITHOUT CONTRAST , MRA OF NECK WITH AND WITHOUT CONSTRAST;  Surgeon: Radiologist, Medication, MD;  Location: [facility];  Service: Radiology;  Laterality: N/A;    There were no vitals filed for this visit.  Subjective Assessment - 11/06/18 2014    Subjective  Pt states she is doing much better; states her MRI was normal; knows she is going to have to learn how to manage stress to prevent episodes of vertigo; states she is still unable to drive over overpasses but is able to drive around town    Pt reports she is having back pain today due to having decorated her house for Christmas   Pertinent History  intermittent claudication, plantar fasciitis, syncope, vertigo, PNA    Diagnostic tests  Scheduled for MRI under sedation in October to rule out vascular causes or tumor, CT was  WNL    Patient Stated Goals  To get better and be able to drive again (on the highway).    Currently in Pain?  Other (Comment)   back soreness      NeuroRe-ed:  Pt amb. 56' with horizontal and 33' with vertical head turns - pt had no c/o dizziness and no LOB  Tandem stance performed for 20 secs  SLS on each foot - 10 secs  Gait velocity 7.81 secs 1st trial, 9.25 secs  = 4.20 ft/sec/ 3.55 ft/sec 2nd rep  Reviewed HEP as previously instructed  TherEx:  Pt requested low back stretches due to having back soreness due to overuse with having decorated her home for the holidays: Pt performed pelvic tilts x 5 reps with 5 sec hold;  Single knee to chest, double knee to chest, and trunk rotation to Rt and Lt sides    Pt performed cervical flexion, extension, rotation and lateral flexion - cervical AROM is WNL's                         PT Short Term Goals - 10/08/18 1607      PT SHORT TERM GOAL #1   Title  = LTG        PT  Long Term Goals - 11/05/18 1453      PT LONG TERM GOAL #1   Title  Pt will demonstrate independence with final HEP    Status  Achieved      PT LONG TERM GOAL #2   Title  Pt will demonstrate 8-10 deg increase in cervical flexion/extension/sidebending with reports of abolishment of occipital headaches     Status  Achieved      PT LONG TERM GOAL #3   Title  Pt will report 0/10 dizziness when performing head turns and changes in direction during gait demonstrating improvement in functional mobility     Status  Achieved      PT LONG TERM GOAL #4   Title  Pt will improve gait speed time to >4.37 ft/sec indicating normal walking speed with no reports of dizziness or headaches     Baseline  4.28 ft/sec  *Met per notes on 09/14/2018; 7.81, 9.25 ; 3.55 ft/sec , 4.20 ft/sec     Status  Not Met      PT LONG TERM GOAL #5   Title  Pt will improve FGA score to >/= to 28/30 to due to improved stability with removal of vision and vertical head nods during  gait    Status  Achieved      PT LONG TERM GOAL #6   Title  Pt will report ability to drive 4-5 exits on interstate with minimal complaints of dizziness or onset of headaches demonstrating improvement in functional independence    Status  Not Met      PT LONG TERM GOAL #7   Title  Pt will demonstrate a 2-3 line difference in the dynamic visual acuity assessment indicating improvement in VOR    Status  Achieved              Patient will benefit from skilled therapeutic intervention in order to improve the following deficits and impairments:     Visit Diagnosis: Dizziness and giddiness  Unsteadiness on feet     Problem List Patient Active Problem List   Diagnosis Date Noted  . Myofascial pain syndrome, cervical 10/23/2018  . Vertigo 08/28/2018  . GERD 08/17/2007    PHYSICAL THERAPY DISCHARGE SUMMARY  Visits from Start of Care: 16  Current functional level related to goals / functional outcomes: See above for progress towards goals - majority of LTG's met   Remaining deficits: Pt reports she is still unable to drive over overpasses on highway due to provocation of dizziness Pt reports cervical pain has significantly improved with use of dry needling modality performed   Education / Equipment: Pt has been instructed in HEP for cervical AROM/stretching and balance/vestibular exercises; states she plans on returning to gym for workouts as she did previously  Plan: Patient agrees to discharge.  Patient goals were partially met. Patient is being discharged due to the patient's request.  ?????        Alda Lea, PT 11/06/2018, 8:24 PM  [facility] [address] [place], [place], [address] Phone: [phone]   Fax:  [phone]  Name: Desiree Gardner MRN: [id number] Date of Birth: 09/24/1966

## 2018-11-07 ENCOUNTER — Ambulatory Visit: Payer: Managed Care, Other (non HMO) | Admitting: Physical Therapy

## 2018-12-24 ENCOUNTER — Other Ambulatory Visit: Payer: Self-pay | Admitting: Obstetrics & Gynecology

## 2018-12-24 DIAGNOSIS — Z1231 Encounter for screening mammogram for malignant neoplasm of breast: Secondary | ICD-10-CM

## 2018-12-25 ENCOUNTER — Telehealth: Payer: Self-pay | Admitting: Neurology

## 2018-12-25 DIAGNOSIS — M7918 Myalgia, other site: Secondary | ICD-10-CM

## 2018-12-25 NOTE — Telephone Encounter (Signed)
Pt is needing a RX for PT for dry needling. Pt would like to be called once this is done so that she can call to schedule appt. Please advise.

## 2018-12-25 NOTE — Telephone Encounter (Signed)
Returned call to patient.  Left message letting her know the orders have been placed (ok per DPR).  Provided our number to call back with any questions.

## 2018-12-25 NOTE — Telephone Encounter (Signed)
Per vo by Dr. Jaynee Eagles, okay to provide requested orders.

## 2019-01-25 ENCOUNTER — Ambulatory Visit
Admission: RE | Admit: 2019-01-25 | Discharge: 2019-01-25 | Disposition: A | Discharge status: Home / Self Care | Payer: Managed Care, Other (non HMO) | Source: Ambulatory Visit | Attending: Obstetrics & Gynecology | Admitting: Obstetrics & Gynecology

## 2019-01-25 DIAGNOSIS — Z1231 Encounter for screening mammogram for malignant neoplasm of breast: Secondary | ICD-10-CM

## 2019-12-18 ENCOUNTER — Other Ambulatory Visit: Payer: Self-pay | Admitting: Obstetrics & Gynecology

## 2019-12-18 DIAGNOSIS — Z1231 Encounter for screening mammogram for malignant neoplasm of breast: Secondary | ICD-10-CM

## 2020-01-28 ENCOUNTER — Ambulatory Visit
Admission: RE | Admit: 2020-01-28 | Discharge: 2020-01-28 | Disposition: A | Discharge status: Home / Self Care | Payer: Managed Care, Other (non HMO) | Source: Ambulatory Visit | Attending: Obstetrics & Gynecology | Admitting: Obstetrics & Gynecology

## 2020-01-28 ENCOUNTER — Other Ambulatory Visit: Payer: Self-pay

## 2020-01-28 DIAGNOSIS — Z1231 Encounter for screening mammogram for malignant neoplasm of breast: Secondary | ICD-10-CM

## 2021-01-04 ENCOUNTER — Other Ambulatory Visit: Payer: Self-pay | Admitting: Obstetrics & Gynecology

## 2021-01-04 DIAGNOSIS — Z1231 Encounter for screening mammogram for malignant neoplasm of breast: Secondary | ICD-10-CM

## 2021-02-16 ENCOUNTER — Ambulatory Visit
Admission: RE | Admit: 2021-02-16 | Discharge: 2021-02-16 | Disposition: A | Discharge status: Home / Self Care | Payer: Managed Care, Other (non HMO) | Source: Ambulatory Visit | Attending: Obstetrics & Gynecology | Admitting: Obstetrics & Gynecology

## 2021-02-16 ENCOUNTER — Other Ambulatory Visit: Payer: Self-pay

## 2021-02-16 DIAGNOSIS — Z1231 Encounter for screening mammogram for malignant neoplasm of breast: Secondary | ICD-10-CM

## 2021-09-03 ENCOUNTER — Other Ambulatory Visit: Payer: Self-pay | Admitting: Internal Medicine

## 2021-09-03 DIAGNOSIS — E785 Hyperlipidemia, unspecified: Secondary | ICD-10-CM

## 2021-09-28 ENCOUNTER — Inpatient Hospital Stay: Admission: RE | Admit: 2021-09-28 | Payer: Managed Care, Other (non HMO) | Source: Ambulatory Visit

## 2021-10-26 ENCOUNTER — Ambulatory Visit
Admission: RE | Admit: 2021-10-26 | Discharge: 2021-10-26 | Disposition: A | Discharge status: Home / Self Care | Payer: No Typology Code available for payment source | Source: Ambulatory Visit | Attending: Internal Medicine | Admitting: Internal Medicine

## 2021-10-26 DIAGNOSIS — E785 Hyperlipidemia, unspecified: Secondary | ICD-10-CM

## 2022-02-07 ENCOUNTER — Other Ambulatory Visit: Payer: Self-pay | Admitting: Obstetrics & Gynecology

## 2022-02-07 DIAGNOSIS — Z1231 Encounter for screening mammogram for malignant neoplasm of breast: Secondary | ICD-10-CM

## 2022-02-18 ENCOUNTER — Other Ambulatory Visit: Payer: Self-pay

## 2022-02-18 ENCOUNTER — Ambulatory Visit
Admission: RE | Admit: 2022-02-18 | Discharge: 2022-02-18 | Disposition: A | Discharge status: Home / Self Care | Payer: Managed Care, Other (non HMO) | Source: Ambulatory Visit | Attending: Obstetrics & Gynecology | Admitting: Obstetrics & Gynecology

## 2022-02-18 DIAGNOSIS — Z1231 Encounter for screening mammogram for malignant neoplasm of breast: Secondary | ICD-10-CM

## 2022-05-04 IMAGING — MG MM DIGITAL SCREENING BILAT W/ TOMO AND CAD
8 series · 9 of 24 positions shown · non-contrast
Comparison: Previous exam(s).

CLINICAL DATA: Screening.

EXAM:
DIGITAL SCREENING BILATERAL MAMMOGRAM WITH TOMOSYNTHESIS AND CAD
TECHNIQUE: Bilateral screening digital craniocaudal and mediolateral oblique
mammograms were obtained. Bilateral screening digital breast
tomosynthesis was performed. The images were evaluated with
computer-aided detection.

[R MLO synth-2D]
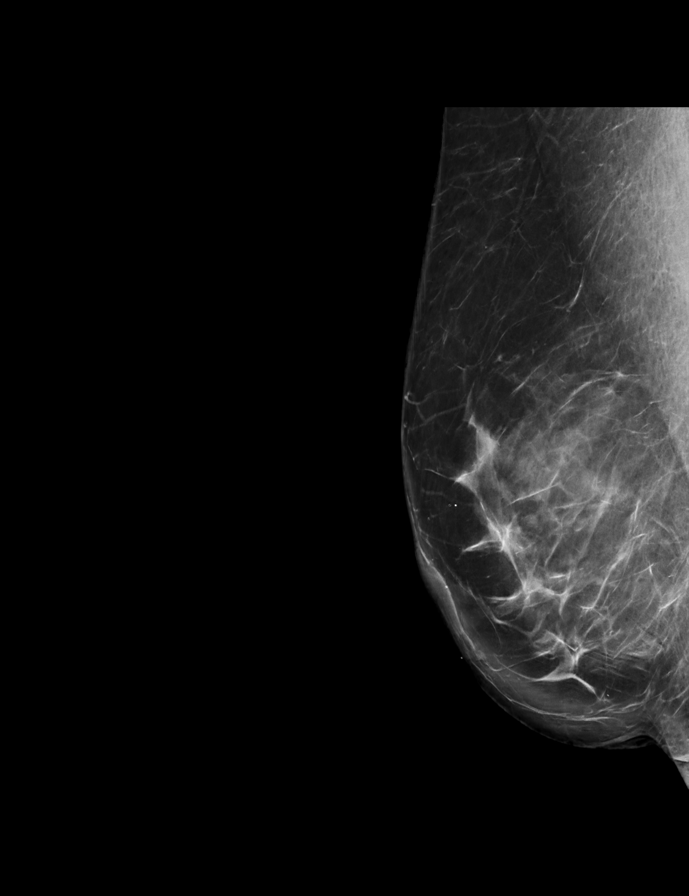

[L CC synth-2D]
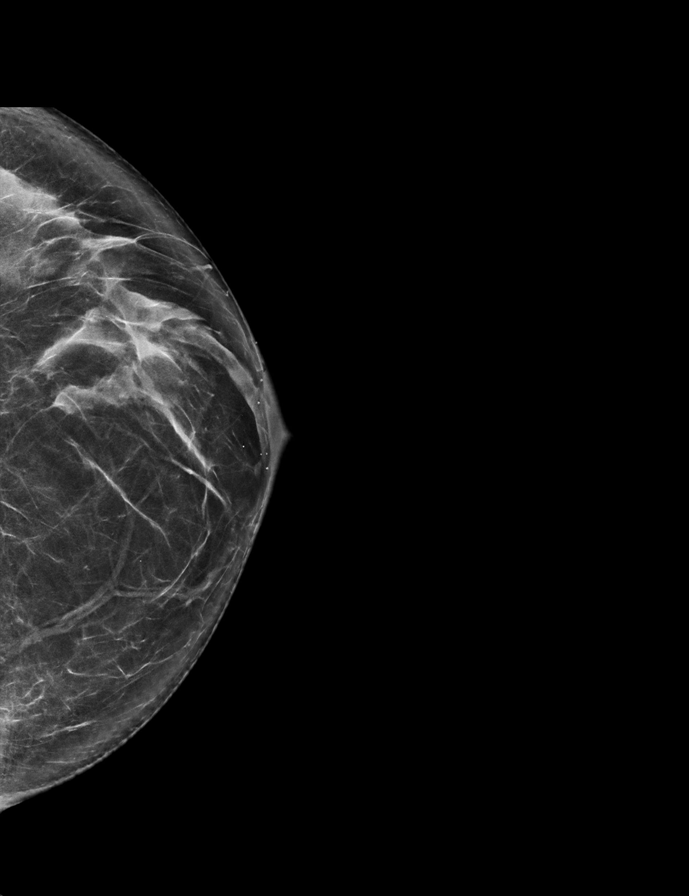

[R CC synth-2D]
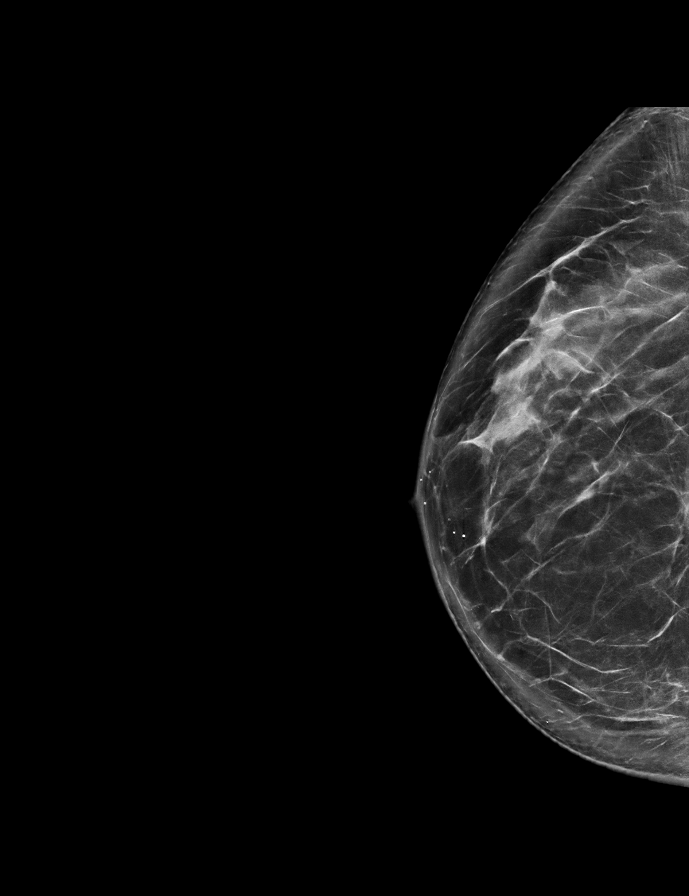

[L MLO synth-2D]
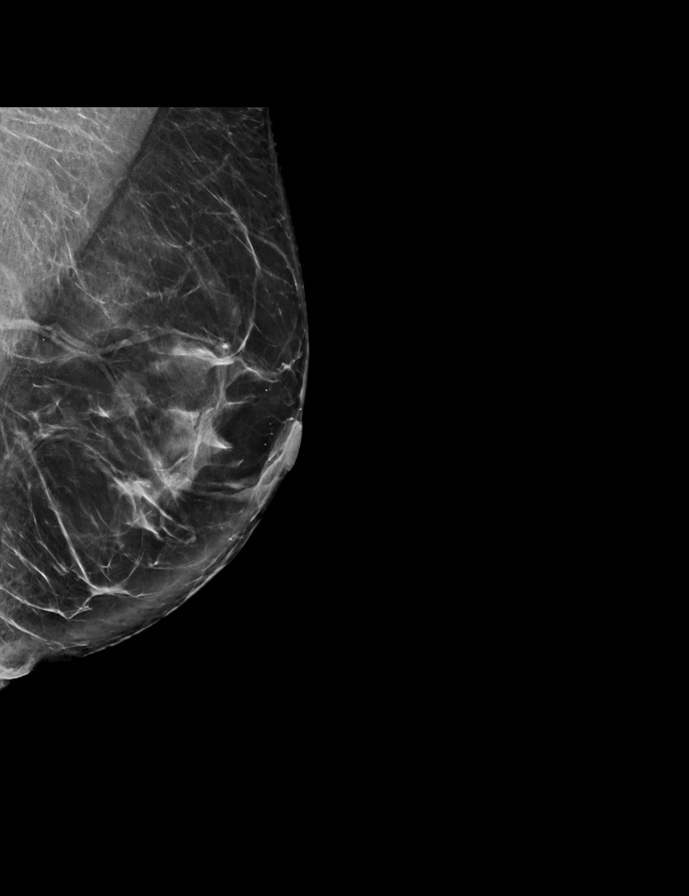

[L MLO tomo · 2 of 78 frames shown]
[frame 26/78]
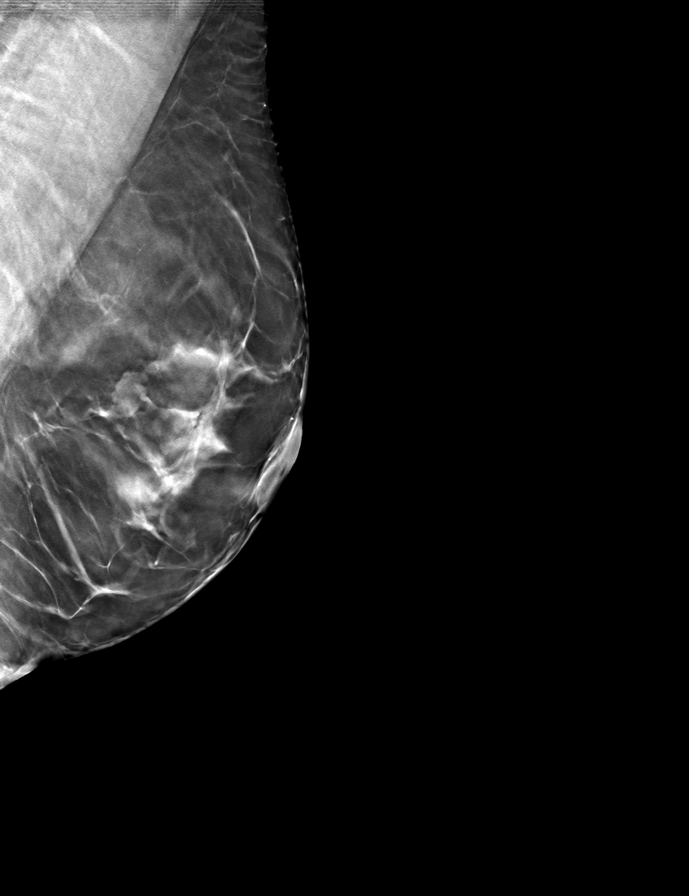
[frame 39/78]
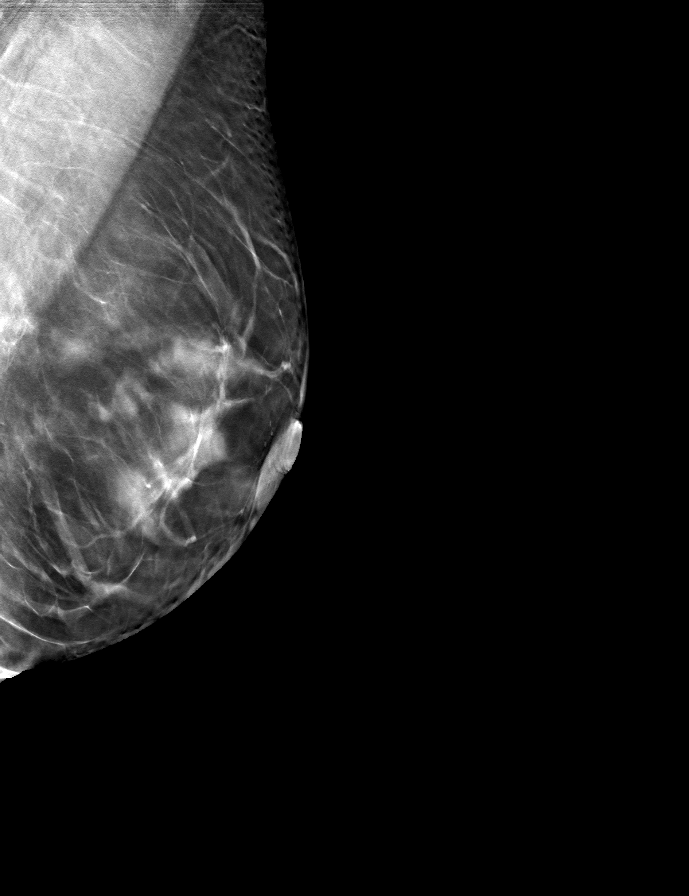

[L CC tomo · tomo slice 33/66.0]
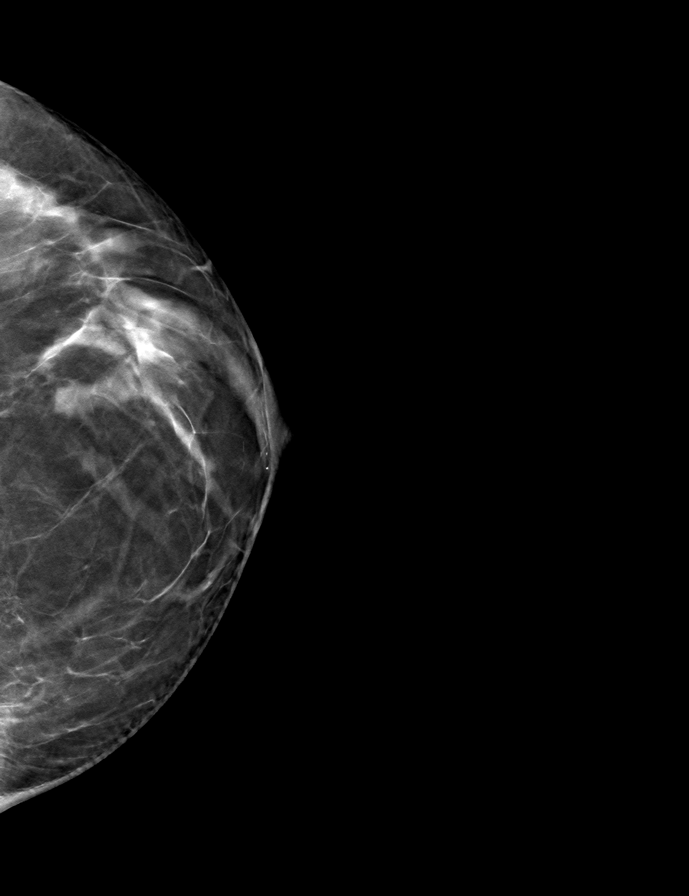

[R CC tomo · tomo slice 37/74.0]
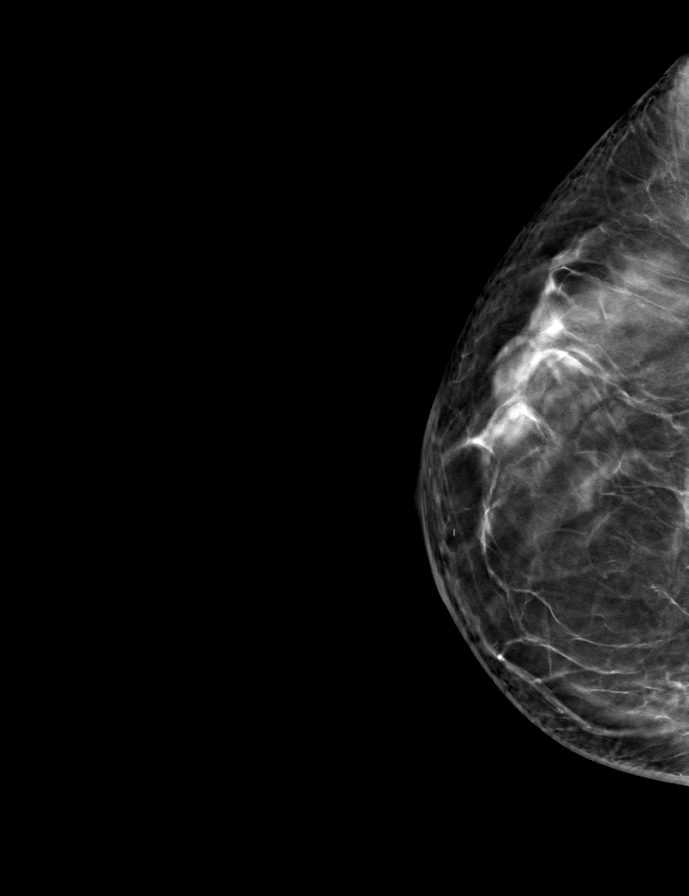

[R MLO tomo · tomo slice 47/93.0]
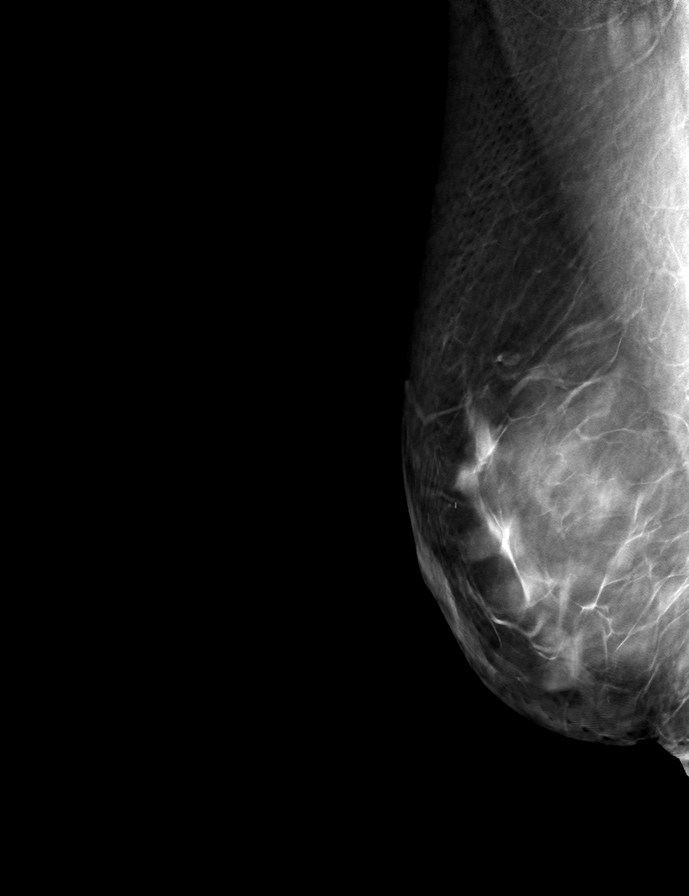

[9 of 24 positions shown; findings below may reference images not displayed]

ACR Breast Density Category b: There are scattered areas of
fibroglandular density.
FINDINGS: There are no findings suspicious for malignancy.
IMPRESSION: No mammographic evidence of malignancy. A result letter of this
screening mammogram will be mailed directly to the patient.

RECOMMENDATION:
Screening mammogram in one year. (Code:51-O-LD2)

BI-RADS CATEGORY  1: Negative.

## 2022-11-09 ENCOUNTER — Encounter: Payer: Self-pay | Admitting: Internal Medicine

## 2022-11-18 ENCOUNTER — Ambulatory Visit (AMBULATORY_SURGERY_CENTER): Payer: Managed Care, Other (non HMO) | Admitting: *Deleted

## 2022-11-18 ENCOUNTER — Other Ambulatory Visit: Payer: Self-pay

## 2022-11-18 ENCOUNTER — Encounter: Payer: Self-pay | Admitting: Gastroenterology

## 2022-11-18 VITALS — Ht 60.5 in | Wt 138.0 lb

## 2022-11-18 DIAGNOSIS — Z1211 Encounter for screening for malignant neoplasm of colon: Secondary | ICD-10-CM

## 2022-11-18 MED ORDER — SUTAB 1479-225-188 MG PO TABS: 1.0000 | ORAL_TABLET | Freq: Once | ORAL | 0 refills | Status: AC

## 2022-11-18 NOTE — Progress Notes (Signed)
Pre visit completed in person  No egg or soy allergy known to patient  No issues known to pt with past sedation with any surgeries or procedures Patient denies ever being told they had issues or difficulty with intubation  No FH of Malignant Hyperthermia Pt is not on diet pills Pt is not on  home 02  Pt is not on blood thinners  Pt denies issues with constipation  Patient requested sutab, coupon provided

## 2022-11-29 ENCOUNTER — Encounter: Payer: Self-pay | Admitting: Gastroenterology

## 2022-11-29 ENCOUNTER — Ambulatory Visit: Payer: Managed Care, Other (non HMO) | Admitting: Gastroenterology

## 2022-11-29 VITALS — BP 120/68 | HR 64 | Temp 97.3°F | Resp 12 | Ht 66.0 in | Wt 138.2 lb

## 2022-11-29 DIAGNOSIS — K635 Polyp of colon: Secondary | ICD-10-CM | POA: Diagnosis not present

## 2022-11-29 DIAGNOSIS — D122 Benign neoplasm of ascending colon: Secondary | ICD-10-CM

## 2022-11-29 DIAGNOSIS — Z1211 Encounter for screening for malignant neoplasm of colon: Secondary | ICD-10-CM

## 2022-11-29 MED ORDER — SODIUM CHLORIDE 0.9 % IV SOLN: 500.0000 mL | INTRAVENOUS | Status: DC

## 2022-11-29 NOTE — Progress Notes (Signed)
Pt's states no medical or surgical changes since previsit or office visit. 

## 2022-11-29 NOTE — Progress Notes (Signed)
Report to pacu rn. Vss. Care resumed by rn. 

## 2022-11-29 NOTE — Op Note (Signed)
[facility] Patient Name: Desiree Gardner Procedure Date: 11/29/2022 11:15 AM MRN: [id number] Endoscopist: Nicki Reaper E. Candis Schatz , MD, [id number] Age: 56 Referring MD:  Date of Birth: 1966/07/20 Gender: Female Account #: [id number] Procedure:                Colonoscopy Indications:              Screening for colorectal malignant neoplasm, This                            is the patient's first colonoscopy Medicines:                Monitored Anesthesia Care Procedure:                Pre-Anesthesia Assessment:                           - Prior to the procedure, a History and Physical                            was performed, and patient medications and                            allergies were reviewed. The patient's tolerance of                            previous anesthesia was also reviewed. The risks                            and benefits of the procedure and the sedation                            options and risks were discussed with the patient.                            All questions were answered, and informed consent                            was obtained. Prior Anticoagulants: The patient has                            taken no anticoagulant or antiplatelet agents. ASA                            Grade Assessment: II - A patient with mild systemic                            disease. After reviewing the risks and benefits,                            the patient was deemed in satisfactory condition to                            undergo the procedure.  After obtaining informed consent, the colonoscope                            was passed under direct vision. Throughout the                            procedure, the patient's blood pressure, pulse, and                            oxygen saturations were monitored continuously. The                            CF [id number] #[id number] was introduced through the anus                            and advanced to  the the terminal ileum, with                            identification of the appendiceal orifice and IC                            valve. The colonoscopy was somewhat difficult due                            to a redundant colon, significant looping and a                            tortuous colon. Successful completion of the                            procedure was aided by using manual pressure and                            withdrawing and reinserting the scope. The patient                            tolerated the procedure well. The quality of the                            bowel preparation was good. The terminal ileum,                            ileocecal valve, appendiceal orifice, and rectum                            were photographed. Scope In: 11:20:26 AM Scope Out: 11:43:03 AM Scope Withdrawal Time: 0 hours 12 minutes 54 seconds  Total Procedure Duration: 0 hours 22 minutes 37 seconds  Findings:                 The perianal and digital rectal examinations were                            normal. Pertinent negatives include normal  sphincter tone and no palpable rectal lesions.                           A 4 mm polyp was found in the ascending colon. The                            polyp was flat. The polyp was removed with a cold                            snare. Resection and retrieval were complete.                            Estimated blood loss was minimal.                           The exam was otherwise normal throughout the                            examined colon.                           The terminal ileum appeared normal.                           The retroflexed view of the distal rectum and anal                            verge was normal and showed no anal or rectal                            abnormalities. Complications:            No immediate complications. Estimated Blood Loss:     Estimated blood loss was minimal. Impression:                - One 4 mm polyp in the ascending colon, removed                            with a cold snare. Resected and retrieved.                           - The examined portion of the ileum was normal.                           - The distal rectum and anal verge are normal on                            retroflexion view. Recommendation:           - Patient has a contact number available for                            emergencies. The signs and symptoms of potential  delayed complications were discussed with the                            patient. Return to normal activities tomorrow.                            Written discharge instructions were provided to the                            patient.                           - Resume previous diet.                           - Continue present medications.                           - Await pathology results.                           - Repeat colonoscopy (date not yet determined) for                            surveillance based on pathology results. Dolph Tavano E. Candis Schatz, MD 11/29/2022 11:48:27 AM This report has been signed electronically.

## 2022-11-29 NOTE — Patient Instructions (Signed)
-   Patient has a contact number available for emergencies. The signs and symptoms of potential delayed complications were discussed with the patient. Return to normal activities tomorrow. Written discharge instructions were provided to the patient. - Resume previous diet. - Continue present medications. - Await pathology results. - Repeat colonoscopy (date not yet determined) for surveillance based on pathology results. -Handout on polyps provided.  YOU HAD AN ENDOSCOPIC PROCEDURE TODAY AT [facility]:   Refer to the procedure report that was given to you for any specific questions about what was found during the examination.  If the procedure report does not answer your questions, please call your gastroenterologist to clarify.  If you requested that your care partner not be given the details of your procedure findings, then the procedure report has been included in a sealed envelope for you to review at your convenience later.  YOU SHOULD EXPECT: Some feelings of bloating in the abdomen. Passage of more gas than usual.  Walking can help get rid of the air that was put into your GI tract during the procedure and reduce the bloating. If you had a lower endoscopy (such as a colonoscopy or flexible sigmoidoscopy) you may notice spotting of blood in your stool or on the toilet paper. If you underwent a bowel prep for your procedure, you may not have a normal bowel movement for a few days.  Please Note:  You might notice some irritation and congestion in your nose or some drainage.  This is from the oxygen used during your procedure.  There is no need for concern and it should clear up in a day or so.  SYMPTOMS TO REPORT IMMEDIATELY:  Following lower endoscopy (colonoscopy or flexible sigmoidoscopy):  Excessive amounts of blood in the stool  Significant tenderness or worsening of abdominal pains  Swelling of the abdomen that is new, acute  Fever of 100F or higher  For urgent or  emergent issues, a gastroenterologist can be reached at any hour by calling [phone]. Do not use MyChart messaging for urgent concerns.    DIET:  We do recommend a small meal at first, but then you may proceed to your regular diet.  Drink plenty of fluids but you should avoid alcoholic beverages for 24 hours.  ACTIVITY:  You should plan to take it easy for the rest of today and you should NOT DRIVE or use heavy machinery until tomorrow (because of the sedation medicines used during the test).    FOLLOW UP: Our staff will call the number listed on your records the next business day following your procedure.  We will call around 7:15- 8:00 am to check on you and address any questions or concerns that you may have regarding the information given to you following your procedure. If we do not reach you, we will leave a message.     If any biopsies were taken you will be contacted by phone or by letter within the next 1-3 weeks.  Please call us at [phone] if you have not heard about the biopsies in 3 weeks.    SIGNATURES/CONFIDENTIALITY: You and/or your care partner have signed paperwork which will be entered into your electronic medical record.  These signatures attest to the fact that that the information above on your After Visit Summary has been reviewed and is understood.  Full responsibility of the confidentiality of this discharge information lies with you and/or your care-partner.

## 2022-11-29 NOTE — Progress Notes (Signed)
[facility] History and Physical   Primary Care Physician:  Prince Solian, MD   Reason for Procedure:   Colon cancer screening  Plan:    Screening colonoscopy     HPI: Desiree Gardner is a 56 y.o. female undergoing initial average risk screening colonoscopy.  She has no family history of colon cancer and no chronic GI symptoms.    Past Medical History:  Diagnosis Date   Headache    Dr. Domingo Cocking   Hyperlipidemia    Osteopenia    Plantar fasciitis    PNA (pneumonia) 2011   Syncope 07/2011   fall, under stress   Vaso vagal episode    Vertigo     Past Surgical History:  Procedure Laterality Date   BREAST REDUCTION SURGERY  11/1986   BUNIONECTOMY  11/2000   RADIOLOGY WITH ANESTHESIA N/A 09/25/2018   Procedure: MRI OF THE BRAIN WITH AND WITHOUT CONTRAST AND MRA OF THE BRAIN WITHOUT CONTRAST , MRA OF NECK WITH AND WITHOUT CONSTRAST;  Surgeon: Radiologist, Medication, MD;  Location: [place];  Service: Radiology;  Laterality: N/A;   REDUCTION MAMMAPLASTY Bilateral 11/1986    Prior to Admission medications   Medication Sig Start Date End Date Taking? Authorizing Provider  Cholecalciferol (VITAMIN D3) 1.25 MG (50000 UT) CAPS Take by mouth.   Yes [provider]  acetaminophen (TYLENOL) 500 MG tablet Take 1,000 mg by mouth 2 (two) times daily as needed for moderate pain or headache. Patient not taking: Reported on 11/29/2022    [provider]  atorvastatin (LIPITOR) 10 MG tablet Take 10 mg by mouth daily.    [provider]  diphenhydrAMINE (BENADRYL) 25 MG tablet Take 50 mg by mouth at bedtime.    [provider]  naproxen sodium (ALEVE) 220 MG tablet Take 440 mg by mouth daily as needed (pain). Patient not taking: Reported on 11/29/2022    [provider]    Current Outpatient Medications  Medication Sig Dispense Refill   Cholecalciferol (VITAMIN D3) 1.25 MG (50000 UT) CAPS Take by mouth.     acetaminophen (TYLENOL)  500 MG tablet Take 1,000 mg by mouth 2 (two) times daily as needed for moderate pain or headache. (Patient not taking: Reported on 11/29/2022)     atorvastatin (LIPITOR) 10 MG tablet Take 10 mg by mouth daily.     diphenhydrAMINE (BENADRYL) 25 MG tablet Take 50 mg by mouth at bedtime.     naproxen sodium (ALEVE) 220 MG tablet Take 440 mg by mouth daily as needed (pain). (Patient not taking: Reported on 11/29/2022)     Current Facility-Administered Medications  Medication Dose Route Frequency Provider Last Rate Last Admin   0.9 %  sodium chloride infusion  500 mL Intravenous Continuous Daryel November, MD        Allergies as of 11/29/2022 - Review Complete 11/29/2022  Allergen Reaction Noted   Sulfonamide derivatives Shortness Of Breath 08/17/2007   Codeine  08/17/2007   Mold extract [trichophyton]  08/27/2018   Pollen extract  08/27/2018   Penicillins Rash 08/17/2007    Family History  Problem Relation Age of Onset   Dementia Mother    Kidney disease Father        on Dialysis, stage IV   Osteoporosis Sister    Glaucoma Sister    Other Brother        prediabetic   Multiple myeloma Paternal Grandmother    Diabetes type II Paternal Grandfather    Dementia Paternal Grandfather  Alzheimer's disease Paternal Grandfather     Social History   Socioeconomic History   Marital status: Married    Spouse name: Not on file   Number of children: 1   Years of education: Not on file   Highest education level: Master's degree (e.g., MA, MS, MEng, MEd, MSW, MBA)  Occupational History   Not on file  Tobacco Use   Smoking status: Former   Smokeless tobacco: Never   Tobacco comments:    smoked cigarette a couple of times in high school  Vaping Use   Vaping Use: Never used  Substance and Sexual Activity   Alcohol use: Yes    Comment: occasionally, not even weekly   Drug use: Never   Sexual activity: Not on file  Other Topics Concern   Not on file  Social History Narrative    Lives at home with spouse & son   Right handed   Caffeine: 2 cups of coffee/day   Social Determinants of Health   Financial Resource Strain: Not on file  Food Insecurity: Not on file  Transportation Needs: Not on file  Physical Activity: Not on file  Stress: Not on file  Social Connections: Not on file  Intimate Partner Violence: Not on file    Review of Systems:  All other review of systems negative except as mentioned in the HPI.  Physical Exam: Vital signs BP (!) 122/52   Pulse 83   Temp (!) 97.3 F (36.3 C)   Ht 5' 6" (1.676 m)   Wt 138 lb 3.2 oz (62.7 kg)   BMI 22.31 kg/m   General:   Alert,  Well-developed, well-nourished, pleasant and cooperative in NAD Airway:  Mallampati 1 Lungs:  Clear throughout to auscultation.   Heart:  Regular rate and rhythm; no murmurs, clicks, rubs,  or gallops. Abdomen:  Soft, nontender and nondistended. Normal bowel sounds.   Neuro/Psych:  Normal mood and affect. A and O x 3   Courtland Reas E. Candis Schatz, MD [facility]

## 2022-11-29 NOTE — Progress Notes (Signed)
Called to room to assist during endoscopic procedure.  Patient ID and intended procedure confirmed with present staff. Received instructions for my participation in the procedure from the performing physician.  

## 2022-11-30 ENCOUNTER — Telehealth: Payer: Self-pay

## 2022-11-30 NOTE — Telephone Encounter (Signed)
  Follow up Call-     11/29/2022   10:52 AM  Call back number  Post procedure Call Back phone  # [phone]  Permission to leave phone message Yes     Patient questions:  Do you have a fever, pain , or abdominal swelling? No. Pain Score  0 *  Have you tolerated food without any problems? Yes.    Have you been able to return to your normal activities? Yes.    Do you have any questions about your discharge instructions: Diet   No. Medications  No. Follow up visit  No.  Do you have questions or concerns about your Care? No.  Actions: * If pain score is 4 or above: No action needed, pain <4.

## 2022-12-06 ENCOUNTER — Encounter: Payer: Self-pay | Admitting: Gastroenterology

## 2023-02-22 ENCOUNTER — Other Ambulatory Visit: Payer: Self-pay | Admitting: Obstetrics & Gynecology

## 2023-02-22 DIAGNOSIS — Z1231 Encounter for screening mammogram for malignant neoplasm of breast: Secondary | ICD-10-CM

## 2023-04-11 ENCOUNTER — Ambulatory Visit: Payer: Managed Care, Other (non HMO)

## 2023-04-17 ENCOUNTER — Ambulatory Visit: Payer: Managed Care, Other (non HMO) | Admitting: Neurology

## 2023-04-21 ENCOUNTER — Ambulatory Visit
Admission: RE | Admit: 2023-04-21 | Discharge: 2023-04-21 | Disposition: A | Discharge status: Home / Self Care | Payer: Managed Care, Other (non HMO) | Source: Ambulatory Visit | Attending: Obstetrics & Gynecology | Admitting: Obstetrics & Gynecology

## 2023-04-21 DIAGNOSIS — Z1231 Encounter for screening mammogram for malignant neoplasm of breast: Secondary | ICD-10-CM

## 2023-05-24 ENCOUNTER — Encounter: Payer: Self-pay | Admitting: *Deleted

## 2023-05-24 ENCOUNTER — Ambulatory Visit: Payer: Managed Care, Other (non HMO) | Admitting: Neurology

## 2023-05-24 ENCOUNTER — Encounter: Payer: Self-pay | Admitting: Neurology

## 2023-05-24 VITALS — BP 114/71 | HR 77 | Ht 65.5 in | Wt 143.6 lb

## 2023-05-24 DIAGNOSIS — R42 Dizziness and giddiness: Secondary | ICD-10-CM

## 2023-05-24 MED ORDER — ONDANSETRON 4 MG PO TBDP: 4.0000 mg | ORAL_TABLET | Freq: Three times a day (TID) | ORAL | 3 refills | Status: AC | PRN

## 2023-05-24 NOTE — Patient Instructions (Signed)
-   been a patient since 2018 and condition dates back to this time - She has a pre-existing medical condition prohibiting her from operating a motor vehicle on the highway - No other restrictions or limitations, otherwise this does not affect her ability to work

## 2023-05-24 NOTE — Progress Notes (Signed)
[facility]    Provider:  Dr Lucia Gaskins Requesting Provider: Chilton Greathouse, MD Primary Care Provider:  Chilton Greathouse, MD  CC:  vertigo  HPI:  Desiree BLONDER is a 57 y.o. female here as requested by Chilton Greathouse, MD for vertigo. has GERD; Vertigo; and Myofascial pain syndrome, cervical on their problem list.  I reviewed Dr. 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  She is being sent for benign paroxysmal vertigo, minimal symptoms using compensatory measures including propel as needed and keeping hydrated possibly consistent with allergic rhinitis, exacerbations and barometric changes, avoiding highway driving, has had brain MRI and also multiple evaluations by ENT, wellness center, integrative health, without real solution per patient report.  She still can't drive on the highway because of her vertigo. She gets vertiginous symptoms every time she drives, she has been reporting this for years and when I saw her in 2019 as well.  She has a pre-existing medical condition prohibiting her from driving on the highway, she gets vestibular symptoms in the car, cannot drive on the highway and maintain control of the car, she saw me years ago and she had an extensive workup with MRI brain, been to PT, saw ENT multiple times, she manages her condition conservatively but she cannot drive on the highway. When she has vestibular symptoms it includes imbalance, nausea, dysequilibrium, disorientation, ongoing for year since 2018 (see my prior note). She went to vestibular therapy. She has a stiff back  and stiff neck and tried dry needling. At this point she has to just manage conservatively and avoid activities that trigger her. No other focal neurologic deficits, associated symptoms, inciting events or modifiable factors.   Reviewed notes, labs and imaging from outside physicians, which showed:  09/25/2018: IMPRESSION: 1.  Normal MRI appearance of the brain. 2. Normal MRA Head and Neck aside from mild ectasia of the distal left vertebral artery.  I reviewed blood work which was drawn September 16, 2022 with a normal CMP with creatinine 0.7 and BUN 19.  CBC was also normal at that time.  TSH was normal 1.51.  Vitamin D 62.  General examination including neurologic examination was normal.  Review of Systems: Patient complains of symptoms per HPI as well as the following symptoms none. Pertinent negatives and positives per HPI. All others negative.   Social History   Socioeconomic History   Marital status: Married    Spouse name: Not on file   Number of children: 1   Years of education: Not on file   Highest education level: Master's degree (e.g., MA, MS, MEng, MEd, MSW, MBA)  Occupational History   Not on file  Tobacco Use   Smoking status: Former   Smokeless tobacco: Never   Tobacco comments:    smoked cigarette a couple of times in high school  Vaping Use   Vaping Use: Never used  Substance and Sexual Activity   Alcohol use: Yes    Comment: occasionally, not even weekly   Drug use: Never   Sexual activity: Not on file  Other Topics Concern   Not on file  Social History Narrative  Lives at home with spouse & son   Right handed   Caffeine: 2 cups of coffee/day   Social Determinants of Health   Financial Resource Strain: Not on file  Food Insecurity: Not on file  Transportation Needs: Not on file  Physical Activity: Not on file  Stress: Not on file  Social Connections: Not on file  Intimate Partner Violence: Not on file    Family History  Problem Relation Age  of Onset   Dementia Mother    Heart Problems Mother    Kidney disease Father        on Dialysis, stage IV   Osteoporosis Sister    Glaucoma Sister    Other Brother        prediabetic   Multiple myeloma Paternal Grandmother    Diabetes type II Paternal Grandfather    Dementia Paternal Grandfather    Alzheimer's disease Paternal Grandfather     Past Medical History:  Diagnosis Date   Headache    Dr. Neale Burly   Hyperlipidemia    Osteopenia    Osteoporosis    Peri-menopausal    Plantar fasciitis    PNA (pneumonia) 2011   Syncope 07/2011   fall, under stress   Vaso vagal episode    Vertigo     Patient Active Problem List   Diagnosis Date Noted   Myofascial pain syndrome, cervical 10/23/2018   Vertigo 08/28/2018   GERD 08/17/2007    Past Surgical History:  Procedure Laterality Date   BREAST REDUCTION SURGERY  11/1986   BUNIONECTOMY  11/2000   RADIOLOGY WITH ANESTHESIA N/A 09/25/2018   Procedure: MRI OF THE BRAIN WITH AND WITHOUT CONTRAST AND MRA OF THE BRAIN WITHOUT CONTRAST , MRA OF NECK WITH AND WITHOUT CONSTRAST;  Surgeon: Radiologist, Medication, MD;  Location: [facility];  Service: Radiology;  Laterality: N/A;   REDUCTION MAMMAPLASTY Bilateral 11/1986    Current Outpatient Medications  Medication Sig Dispense Refill   acetaminophen (TYLENOL) 500 MG tablet Take 1,000 mg by mouth 2 (two) times daily as needed for moderate pain or headache.     atorvastatin (LIPITOR) 10 MG tablet Take 10 mg by mouth daily.     Cholecalciferol (VITAMIN D3) 1.25 MG (50000 UT) CAPS Take by mouth.     diphenhydrAMINE (BENADRYL) 25 MG tablet Take 50 mg by mouth at bedtime.     naproxen sodium (ALEVE) 220 MG tablet Take 440 mg by mouth daily as needed (pain).     ondansetron (ZOFRAN-ODT) 4 MG disintegrating tablet Take 1-2 tablets (4-8 mg total) by mouth every 8 (eight) hours as needed. 30 tablet 3   No current facility-administered medications for this visit.    Allergies as of 05/24/2023  - Review Complete 05/24/2023  Allergen Reaction Noted   Sulfonamide derivatives Shortness Of Breath 08/17/2007   Codeine  08/17/2007   Mold extract [trichophyton]  08/27/2018   Pollen extract  08/27/2018   Penicillins Rash and Other (See Comments) 08/17/2007    Vitals: BP 114/71   Pulse 77   Ht 5' 5.5" (1.664 m)   Wt 143 lb 9.6 oz (65.1 kg)   BMI 23.53 kg/m  Last Weight:  Wt Readings from Last 1 Encounters:  05/24/23 143 lb 9.6 oz (65.1 kg)   Last Height:   Ht Readings from Last 1 Encounters:  05/24/23 5' 5.5" (1.664 m)     Physical exam: Exam: Gen: NAD, conversant, well nourised, well groomed  CV: RRR, no MRG. No Carotid Bruits. No peripheral edema, warm, nontender Eyes: Conjunctivae clear without exudates or hemorrhage  Neuro: Detailed Neurologic Exam  Speech:    Speech is normal; fluent and spontaneous with normal comprehension.  Cognition:    The patient is oriented to person, place, and time;     recent and remote memory intact;     language fluent;     normal attention, concentration,     fund of knowledge Cranial Nerves:    The pupils are equal, round, and reactive to light. The fundi are flat Visual fields are full to finger confrontation. Extraocular movements are intact. Trigeminal sensation is intact and the muscles of mastication are normal. The face is symmetric. The palate elevates in the midline. Hearing intact. Voice is normal. Shoulder shrug is normal. The tongue has normal motion without fasciculations.   Coordination: nml  Gait: nml  Motor Observation:    No asymmetry, no atrophy, and no involuntary movements noted. Tone:    Normal muscle tone.    Posture:    Posture is normal. normal erect    Strength:    Strength is V/V in the upper and lower limbs.      Sensation: intact to LT     Reflex Exam:  DTR's:    Deep tendon reflexes in the upper and lower extremities are symmetrical bilaterally.   Toes:    The toes  are downgoing bilaterally.   Clonus:    Clonus is absent.    Assessment/Plan:  She still can't drive on the highway because of her vertigo. She gets vertiginous symptoms every time she drives, she has been reporting this for years and when I saw her in 2019 as well.  She has a pre-existing medical condition prohibiting her from driving on the highway, she gets vestibular symptoms in the car, cannot drive on the highway and maintain control of the car, she saw me years ago and she had an extensive workup with MRI brain, been to PT, saw ENT multiple times, she manages her condition conservatively but she cannot drive on the highway. When she has vestibular symptoms it includes imbalance, nausea, dysequilibrium, disorientation, ongoing for year since 2018 (see my prior note). She went to vestibular therapy, ophthalmology. She has a stiff back and stiff neck and tried dry needling. At this point she has to just manage conservatively and avoid activities that trigger her.   - been a patient since 2018 and condition dates back to this time - She has a pre-existing medical condition prohibiting her from operating a motor vehicle on the highway - No other restrictions or limitations, otherwise this does not affect her ability to work  Wrote a Physicist, medical for patient  Meds ordered this encounter  Medications   ondansetron (ZOFRAN-ODT) 4 MG disintegrating tablet    Sig: Take 1-2 tablets (4-8 mg total) by mouth every 8 (eight) hours as needed.    Dispense:  30 tablet    Refill:  3      Cc: Chilton Greathouse, MD,  Chilton Greathouse, MD  Naomie Dean, MD  [facility] [address] [place], [place] [address]  Phone [phone] Fax [phone]

## 2024-04-01 ENCOUNTER — Other Ambulatory Visit: Payer: Self-pay | Admitting: Internal Medicine

## 2024-04-01 DIAGNOSIS — Z1231 Encounter for screening mammogram for malignant neoplasm of breast: Secondary | ICD-10-CM

## 2024-04-22 ENCOUNTER — Ambulatory Visit
Admission: RE | Admit: 2024-04-22 | Discharge: 2024-04-22 | Disposition: A | Discharge status: Home / Self Care | Source: Ambulatory Visit | Attending: Internal Medicine | Admitting: Internal Medicine

## 2024-04-22 DIAGNOSIS — Z1231 Encounter for screening mammogram for malignant neoplasm of breast: Secondary | ICD-10-CM

## 2024-05-29 ENCOUNTER — Encounter: Payer: Self-pay | Admitting: Physician Assistant

## 2024-05-29 ENCOUNTER — Ambulatory Visit: Admitting: Physician Assistant

## 2024-05-29 VITALS — BP 117/76

## 2024-05-29 DIAGNOSIS — L239 Allergic contact dermatitis, unspecified cause: Secondary | ICD-10-CM

## 2024-05-29 DIAGNOSIS — W908XXA Exposure to other nonionizing radiation, initial encounter: Secondary | ICD-10-CM | POA: Diagnosis not present

## 2024-05-29 DIAGNOSIS — D229 Melanocytic nevi, unspecified: Secondary | ICD-10-CM

## 2024-05-29 DIAGNOSIS — L814 Other melanin hyperpigmentation: Secondary | ICD-10-CM | POA: Diagnosis not present

## 2024-05-29 DIAGNOSIS — L578 Other skin changes due to chronic exposure to nonionizing radiation: Secondary | ICD-10-CM | POA: Diagnosis not present

## 2024-05-29 DIAGNOSIS — L700 Acne vulgaris: Secondary | ICD-10-CM

## 2024-05-29 DIAGNOSIS — D1801 Hemangioma of skin and subcutaneous tissue: Secondary | ICD-10-CM

## 2024-05-29 DIAGNOSIS — R221 Localized swelling, mass and lump, neck: Secondary | ICD-10-CM

## 2024-05-29 DIAGNOSIS — Z1283 Encounter for screening for malignant neoplasm of skin: Secondary | ICD-10-CM

## 2024-05-29 DIAGNOSIS — Z808 Family history of malignant neoplasm of other organs or systems: Secondary | ICD-10-CM

## 2024-05-29 DIAGNOSIS — L821 Other seborrheic keratosis: Secondary | ICD-10-CM

## 2024-05-29 MED ORDER — TRIAMCINOLONE ACETONIDE 0.1 % EX CREA: 1.0000 | TOPICAL_CREAM | Freq: Two times a day (BID) | CUTANEOUS | 0 refills | Status: AC

## 2024-05-29 NOTE — Progress Notes (Signed)
   New Patient Visit   Subjective  Desiree Gardner is a 58 y.o. female who presents for the following: Skin Cancer Screening and Full Body Skin Exam - No history of skin cancer. Family history of skin cancer - mother. She has had a mole on her right mandible for about 10 years. A few weeks ago it started getting bigger and it drained.  Concerned about swelling anterior neck and rash on legs that she just noticed today.   The patient presents for Total-Body Skin Exam (TBSE) for skin cancer screening and mole check. The patient has spots, moles and lesions to be evaluated, some may be new or changing and the patient may have concern these could be cancer.    The following portions of the chart were reviewed this encounter and updated as appropriate: medications, allergies, medical history  Review of Systems:  No other skin or systemic complaints except as noted in HPI or Assessment and Plan.  Objective  Well appearing patient in no apparent distress; mood and affect are within normal limits.  A full examination was performed including scalp, head, eyes, ears, nose, lips, neck, chest, axillae, abdomen, back, buttocks, bilateral upper extremities, bilateral lower extremities, hands, feet, fingers, toes, fingernails, and toenails. All findings within normal limits unless otherwise noted below.   Relevant physical exam findings are noted in the Assessment and Plan.    Assessment & Plan   SKIN CANCER SCREENING PERFORMED TODAY.  ACTINIC DAMAGE - Chronic condition, secondary to cumulative UV/sun exposure - diffuse scaly erythematous macules with underlying dyspigmentation - Recommend daily broad spectrum sunscreen SPF 30+ to sun-exposed areas, reapply every 2 hours as needed.  - Staying in the shade or wearing long sleeves, sun glasses (UVA+UVB protection) and wide brim hats (4-inch brim around the entire circumference of the hat) are also recommended for sun protection.  - Call for new or  changing lesions.  LENTIGINES, SEBORRHEIC KERATOSES, HEMANGIOMAS - Benign normal skin lesions - Benign-appearing - Call for any changes  MELANOCYTIC NEVI - Tan-brown and/or pink-flesh-colored symmetric macules and papules - Benign appearing on exam today - Observation - Call clinic for new or changing moles - Recommend daily use of broad spectrum spf 30+ sunscreen to sun-exposed areas.   FAMILY HISTORY OF SKIN CANCER What type(s): Unsure Who affected: Mother  ALLERGIC CONTACT DERMATITIS Exam: scaly pink papules and/or plaques +/- vesiculation   Treatment Plan: TMC 0.1% cream Apply to affected area of rash of legs twice daily until clear. Avoid face, groin, underarms.  OPEN COMEDONE  Exam: Comedone of right mandible  Treatment Plan: No treatment at this time.   SUBCUTANEOUS NODULE OF NECK - CYST VS OTHER  Exam: Palpable nodule of anterior neck  Treatment Plan: Recommend evaluation with PCP for possible thyroid issue.   SCREENING EXAM FOR SKIN CANCER   ACTINIC SKIN DAMAGE   LENTIGINES   SEBORRHEIC KERATOSIS   CHERRY ANGIOMA   MULTIPLE BENIGN NEVI   FAMILY HISTORY OF SKIN CANCER   ALLERGIC CONTACT DERMATITIS, UNSPECIFIED TRIGGER   OPEN COMEDONE   SUBCUTANEOUS NODULE OF NECK    Return in about 2 years (around 05/29/2026) for TBSE.  I, Eliot Guernsey, CMA, am acting as scribe for Teshaun Olarte K, PA-C .   Documentation: I have reviewed the above documentation for accuracy and completeness, and I agree with the above.  Lateshia Schmoker K, PA-C

## 2024-05-29 NOTE — Patient Instructions (Signed)

## 2024-06-07 ENCOUNTER — Other Ambulatory Visit: Payer: Self-pay | Admitting: Internal Medicine

## 2024-06-07 DIAGNOSIS — E041 Nontoxic single thyroid nodule: Secondary | ICD-10-CM

## 2024-06-13 ENCOUNTER — Ambulatory Visit
Admission: RE | Admit: 2024-06-13 | Discharge: 2024-06-13 | Disposition: A | Discharge status: Home / Self Care | Source: Ambulatory Visit | Attending: Internal Medicine | Admitting: Internal Medicine

## 2024-06-13 DIAGNOSIS — E041 Nontoxic single thyroid nodule: Secondary | ICD-10-CM

## 2024-09-30 ENCOUNTER — Ambulatory Visit: Admitting: Dermatology

## 2024-10-02 ENCOUNTER — Ambulatory Visit (HOSPITAL_COMMUNITY): Payer: Self-pay

## 2025-06-02 ENCOUNTER — Ambulatory Visit: Admitting: Physician Assistant
